# Patient Record
Sex: Male | Born: 1960
Health system: Southern US, Community
[De-identification: ages and names within clinical notes are randomized; demographics above are authoritative.]

## PROBLEM LIST (undated history)

## (undated) DIAGNOSIS — C801 Malignant (primary) neoplasm, unspecified: Secondary | ICD-10-CM

## (undated) DIAGNOSIS — M1990 Unspecified osteoarthritis, unspecified site: Secondary | ICD-10-CM

## (undated) DIAGNOSIS — N189 Chronic kidney disease, unspecified: Secondary | ICD-10-CM

## (undated) DIAGNOSIS — R011 Cardiac murmur, unspecified: Secondary | ICD-10-CM

## (undated) DIAGNOSIS — I10 Essential (primary) hypertension: Secondary | ICD-10-CM

## (undated) HISTORY — DX: Chronic kidney disease, unspecified: N18.9

## (undated) HISTORY — DX: Cardiac murmur, unspecified: R01.1

## (undated) HISTORY — DX: Malignant (primary) neoplasm, unspecified: C80.1

## (undated) HISTORY — DX: Essential (primary) hypertension: I10

## (undated) HISTORY — DX: Unspecified osteoarthritis, unspecified site: M19.90

---

## 2000-12-27 ENCOUNTER — Encounter: Payer: Self-pay | Admitting: Emergency Medicine

## 2000-12-27 ENCOUNTER — Inpatient Hospital Stay (HOSPITAL_COMMUNITY): Admission: EM | Admit: 2000-12-27 | Discharge: 2000-12-31 | Payer: Self-pay | Admitting: Emergency Medicine

## 2005-07-09 ENCOUNTER — Encounter: Admission: RE | Admit: 2005-07-09 | Discharge: 2005-07-09 | Payer: Self-pay | Admitting: Family Medicine

## 2006-08-28 IMAGING — CT CT PELVIS W/O CM
1 of 2 series · 15 of 32 positions shown, 19 images · IV contrast (agent unspecified)
Comparison: none

CLINICAL DATA: Hematuria.  Some right abdominal pain. 
 ABDOMEN CT WITHOUT CONTRAST:
TECHNIQUE: Multidetector CT imaging of the abdomen was performed following the standard protocol without IV contrast.
TECHNIQUE: Multidetector CT imaging of the pelvis was performed following the standard protocol without IV contrast.

[Series 2: renal stone · axial · 0.70mm/px · z∈[-375,-25]mm · 15 of 79 slices shown, 19 images]
[im 6/79  soft-tissue]
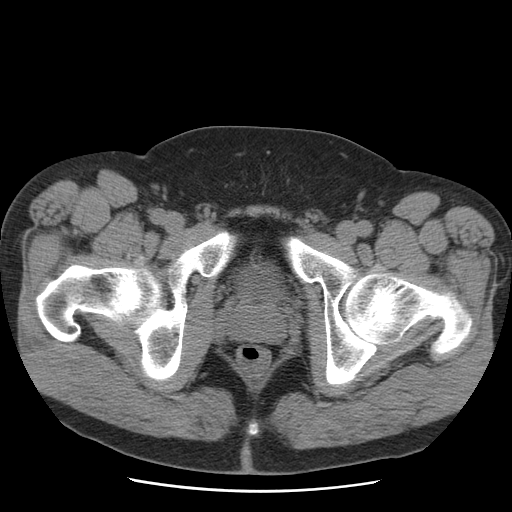
[im 6/79  bone]
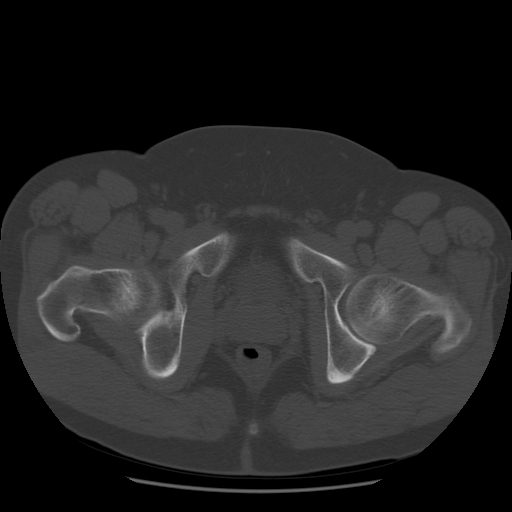
[im 11/79  soft-tissue]
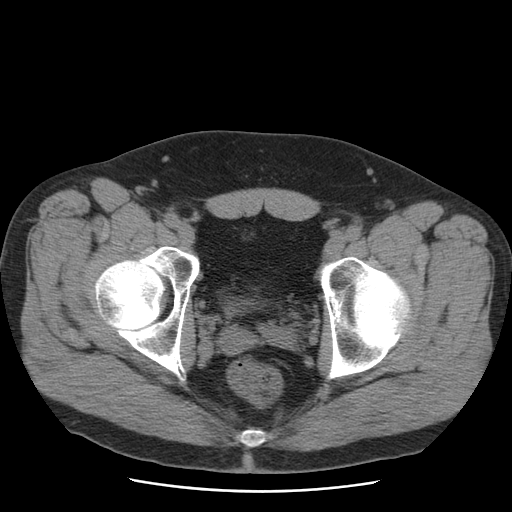
[im 17/79  soft-tissue]
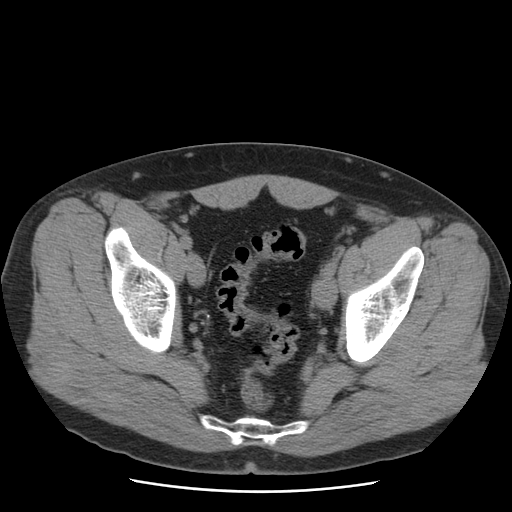
[im 22/79  soft-tissue]
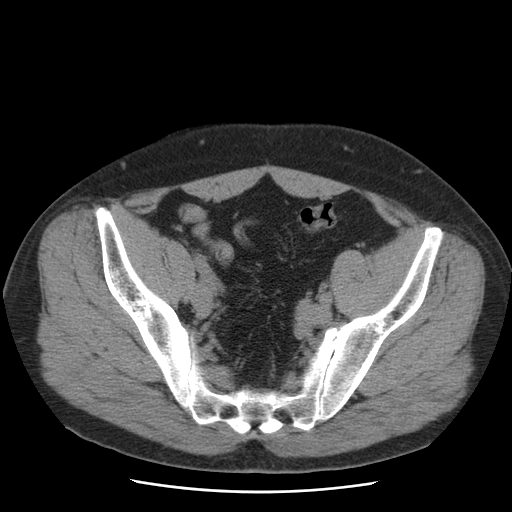
[im 27/79  soft-tissue]
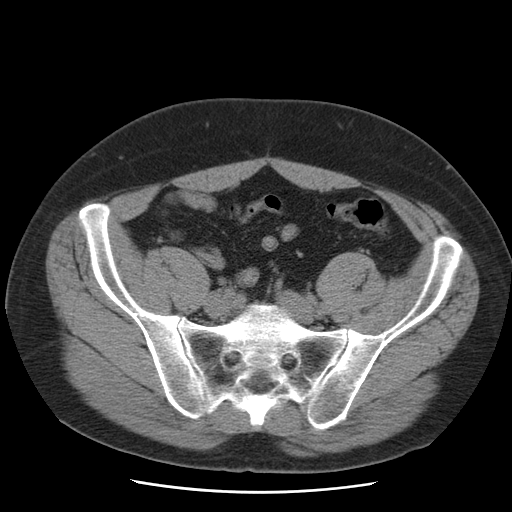
[im 33/79  soft-tissue]
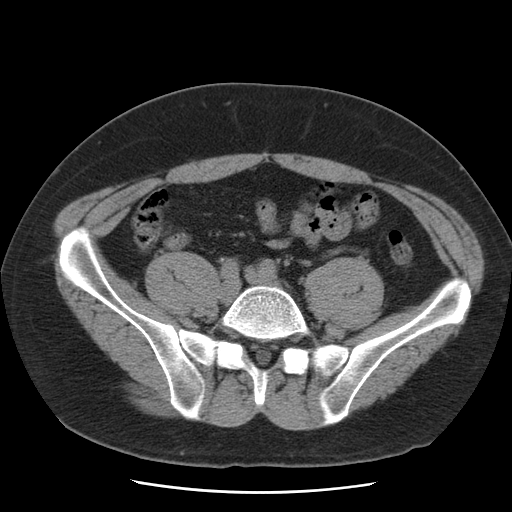
[im 41/79  soft-tissue]
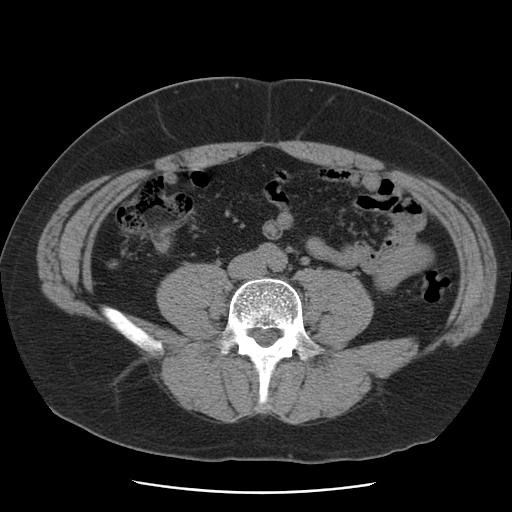
[im 46/79  soft-tissue]
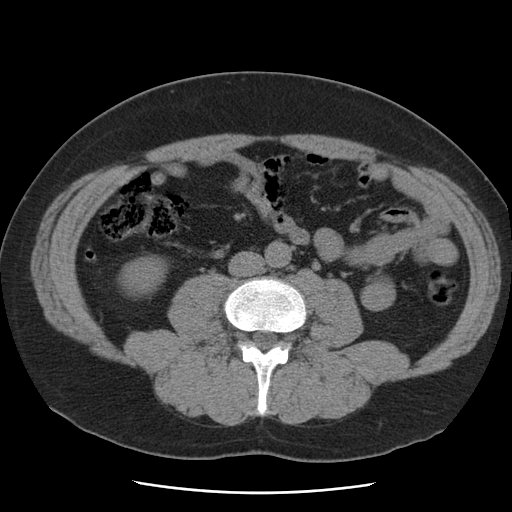
[im 52/79  soft-tissue]
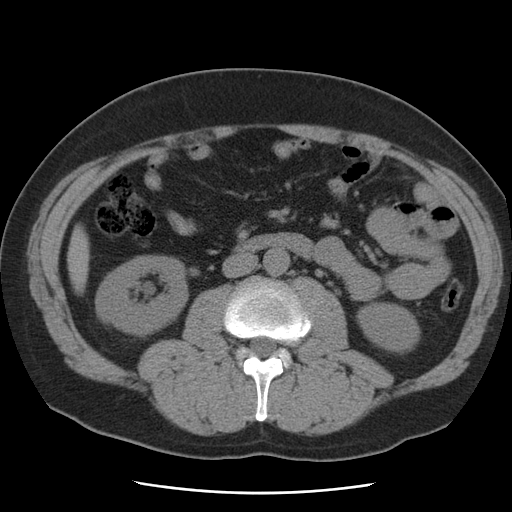
[im 52/79  bone]
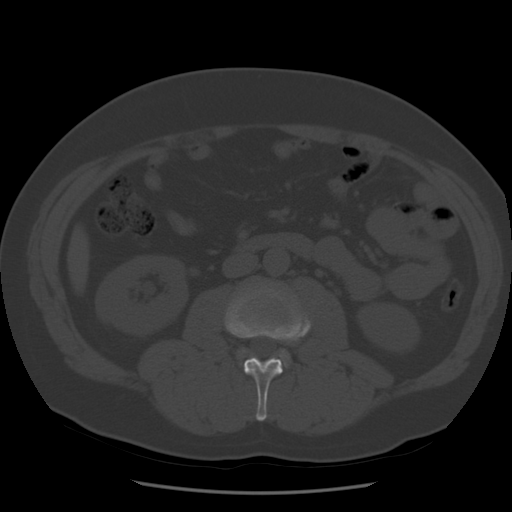
[im 57/79  soft-tissue]
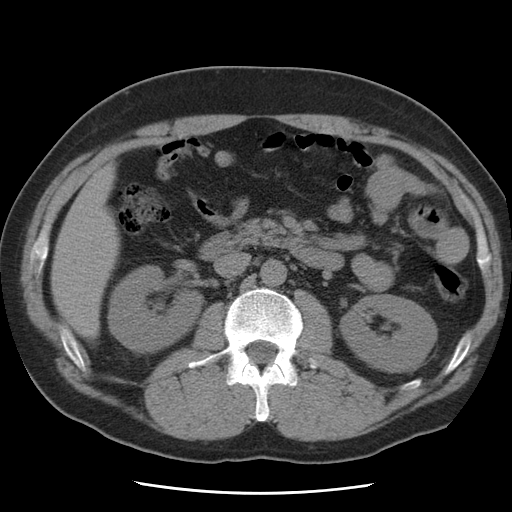
[im 62/79  soft-tissue]
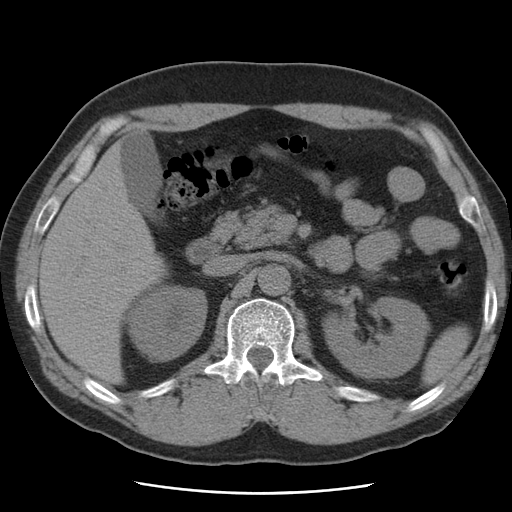
[im 68/79  soft-tissue]
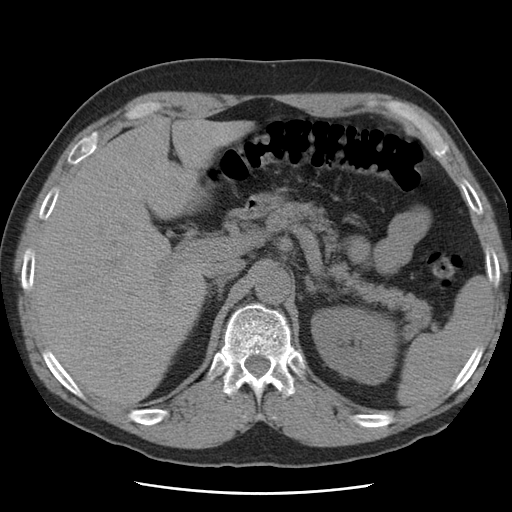
[im 68/79  lung]
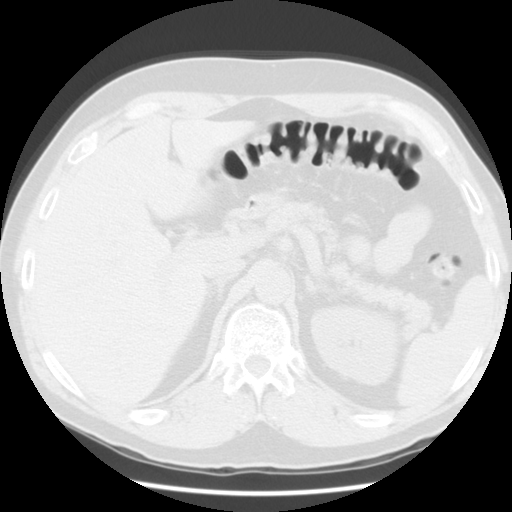
[im 70/79  lung]
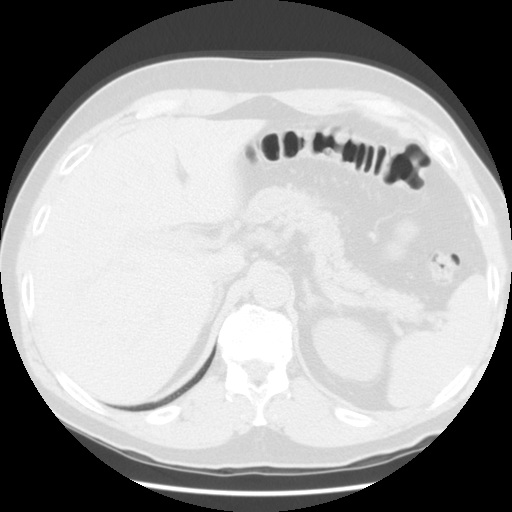
[im 73/79  soft-tissue]
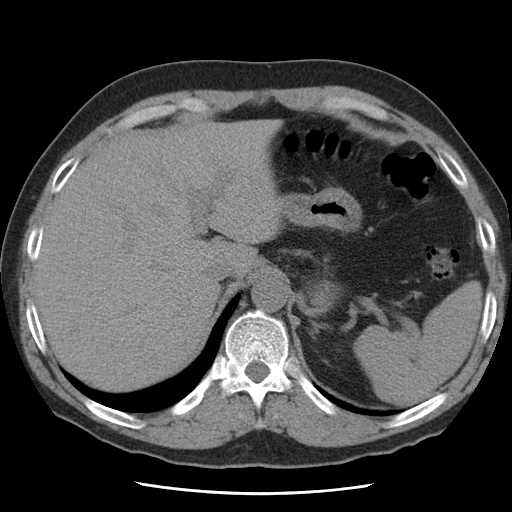
[im 73/79  lung]
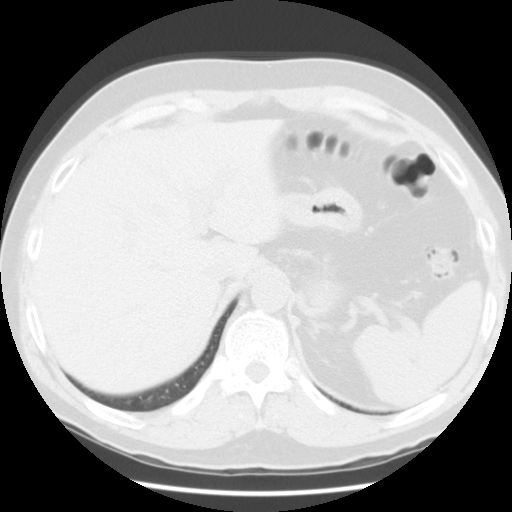
[im 76/79  lung]
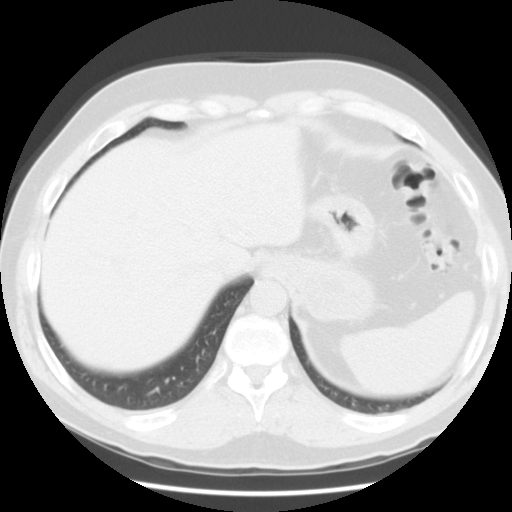

[15 of 32 positions shown; findings below may reference images not displayed]

FINDINGS: The lung bases are clear.  Only a small non-obstructing left upper pole renal calculus is noted of no more than 2 mm.  However there does appear to be fullness of the right pelvocaliceal system and right ureter and CT of the pelvis will be performed.  The liver appears grossly normal in the unenhanced state.  No calcified gallstones are seen.  The pancreas is normal with normal peripancreatic fat planes.  The adrenal glands and spleen appear normal.  The abdominal aorta is normal in caliber.
IMPRESSION: 1.  Mild right hydronephrosis and minimal fullness of the proximal right ureter.  CT of the pelvis to be performed.  
 2.  Single non-obstructing left upper pole renal calculus. 
 PELVIS CT WITHOUT CONTRAST:
FINDINGS: The right ureter is minimally prominent to point of partial obstruction by a right ureteral calculus on image number 47 measuring 2 mm.  The more distal ureters are normal in caliber.  The urinary bladder is decompressed and unremarkable.  The prostate is normal in size.  A few scattered rectosigmoid colonic diverticula are noted.  The appendix appears normal.
IMPRESSION: 1.  2 mm minimally obstructing distal right ureteral calculus overlying the right sacrum. 
 2.  Mild diverticulosis.  
 3.  Appendix appears normal.

## 2007-05-28 ENCOUNTER — Encounter: Admission: RE | Admit: 2007-05-28 | Discharge: 2007-05-28 | Payer: Self-pay | Admitting: Family Medicine

## 2008-07-16 IMAGING — CR DG CHEST 2V
2 series · 2 of 2 positions shown · non-contrast
Comparison: none

CLINICAL DATA: Cough for six months. 
 CHEST - 2 VIEW:

[w chest pa]
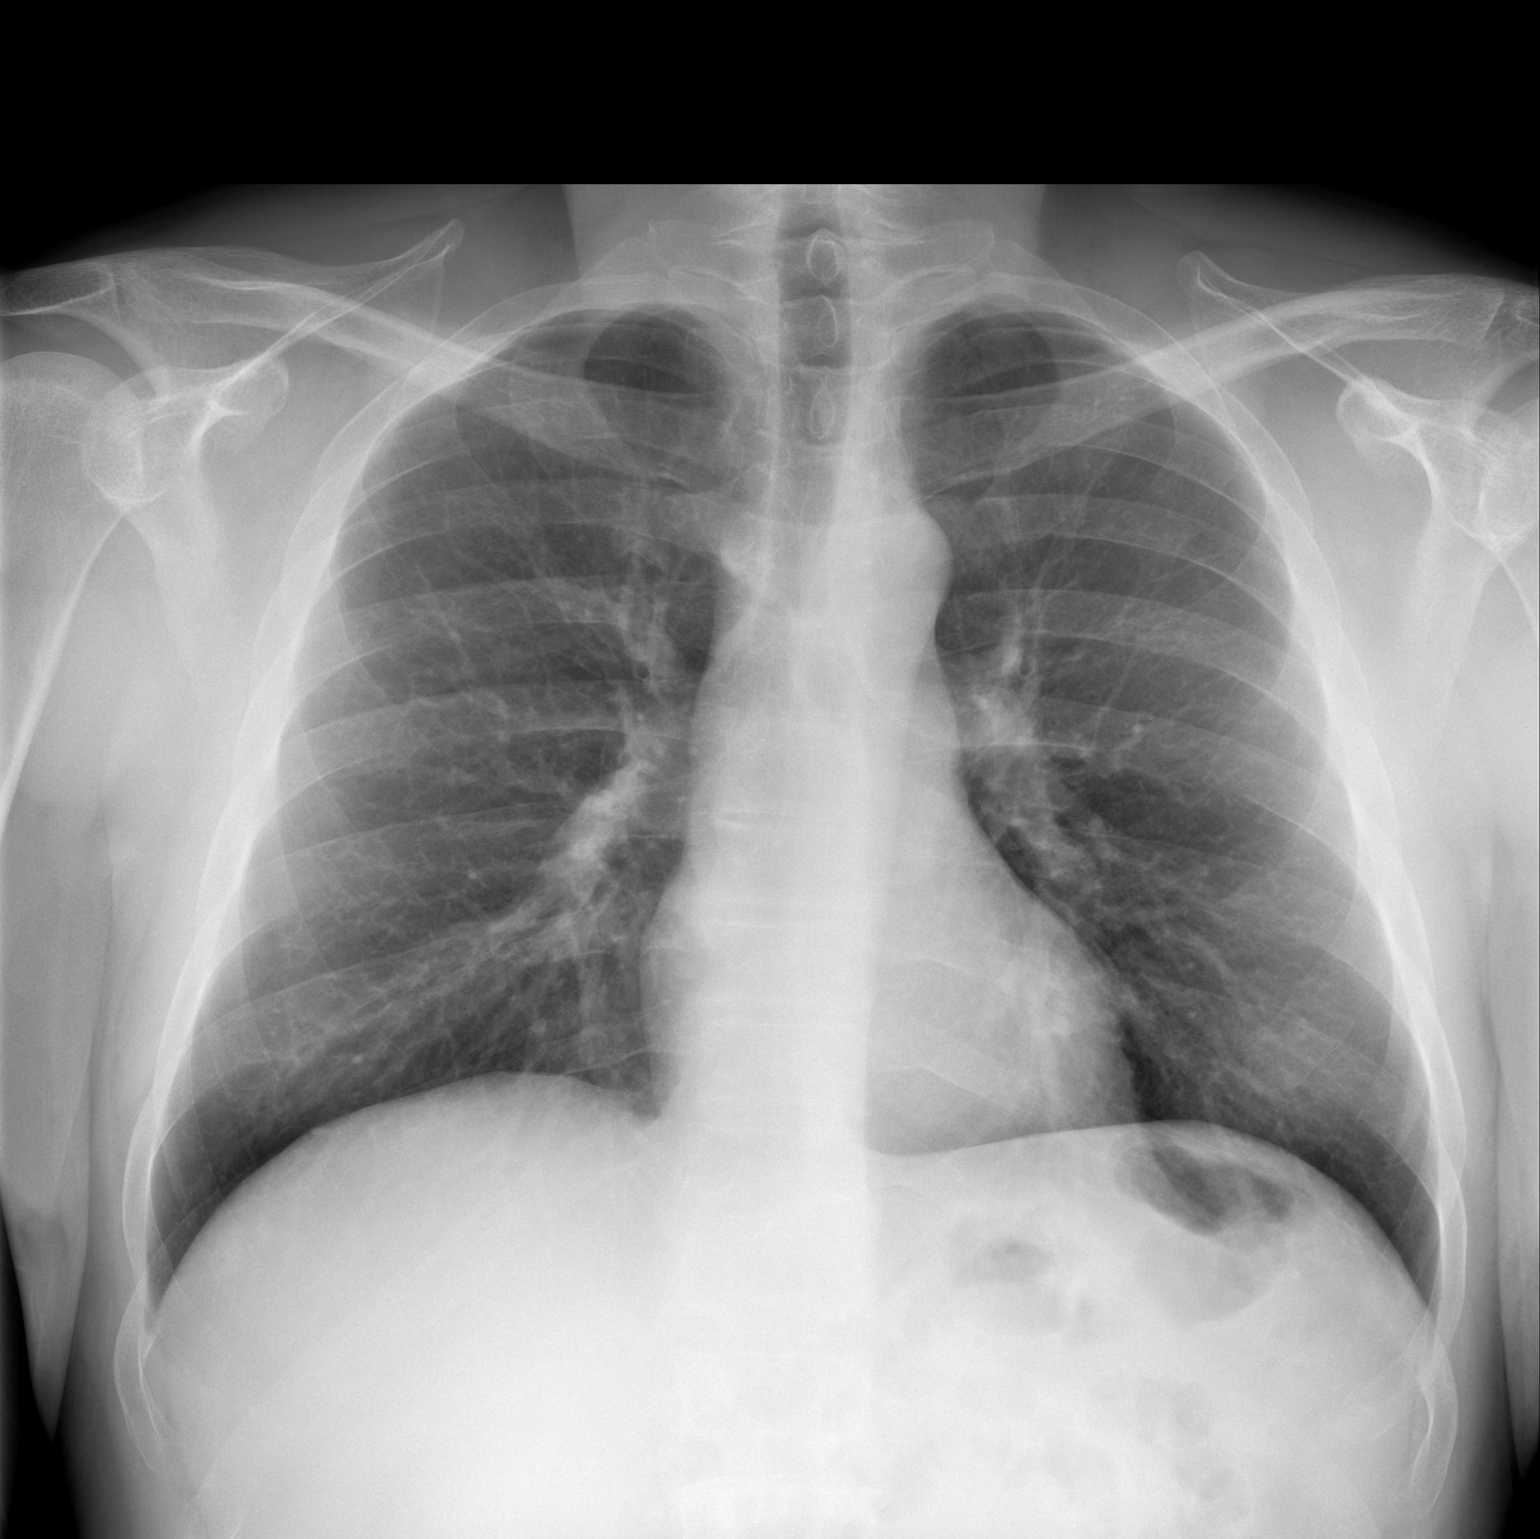

[w chest lat]
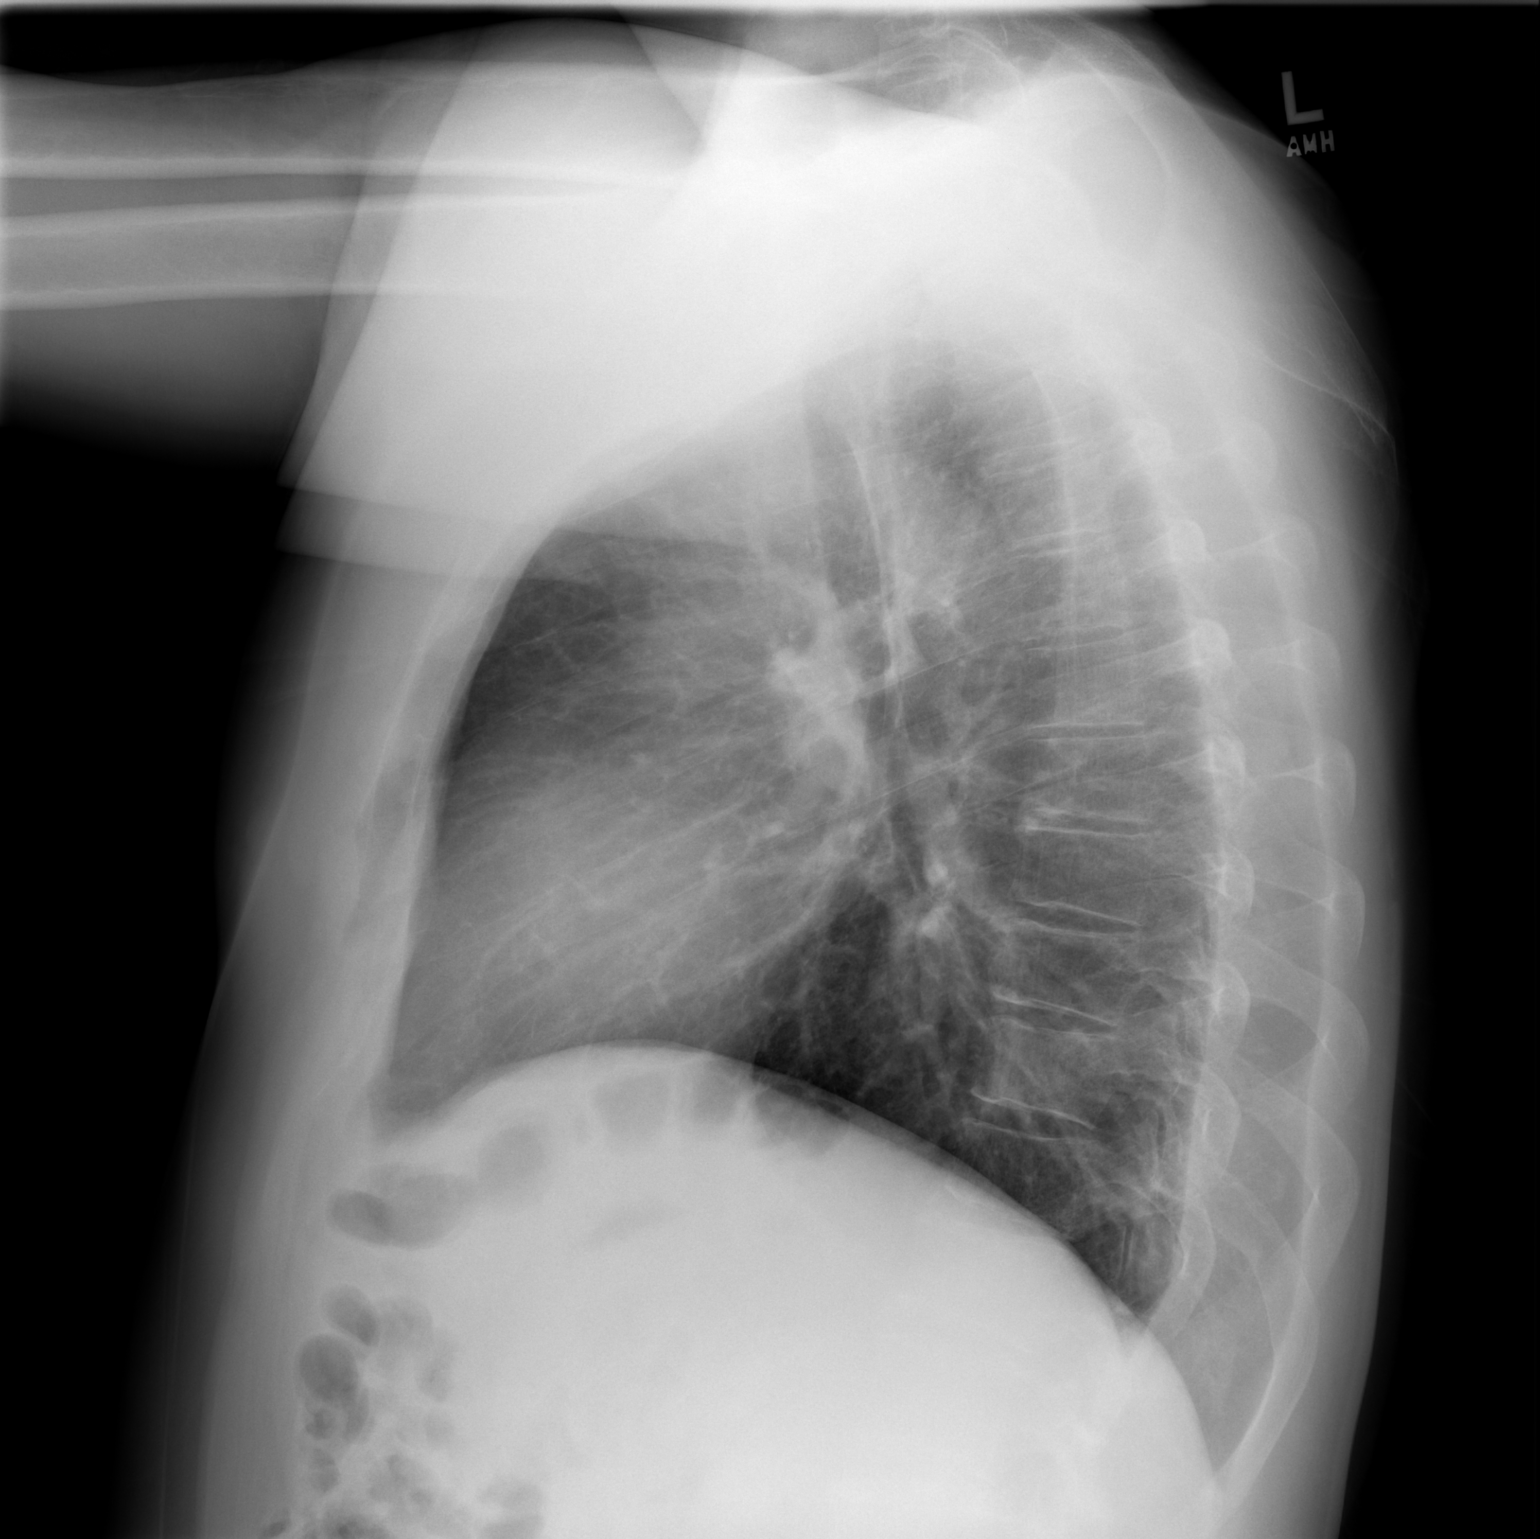

[2 of 2 positions shown; findings below may reference images not displayed]

FINDINGS: Heart size is normal.  The mediastinum is unremarkable.  The lungs are clear.  No effusions.  No soft tissue or bony abnormalities.
IMPRESSION: Normal chest.

## 2011-03-07 NOTE — H&P (Signed)
[place]. [facility]  Patient:    Tony Jacobs, Tony Jacobs                         MRN: [id number] Adm. Date:  [id number] Attending:  Benita Stabile                         History and Physical  CHIEF COMPLAINT: Fever and rash.  HISTORY OF PRESENT ILLNESS: The patient is a 50 year old white male with long-standing rheumatoid arthritis, treated with methotrexate and Plaquenil, with no change for a long time.  Three weeks ago he started having fever and aggravation of his knees and fingers and was started on low-dose prednisone. Several days later he developed a rash involving his back and his legs only. The swelling has continued to get worse and he has developed persistent fever since that time.  He was seen in the office by Dr. Valentina Lucks and no definitive diagnosis was made at that time.  He was then seen by his rheumatologist at [facility], Dr. Concepcion Living, and he had aspiration of his right knee joint with dark yellow fluid noted.  It was not actually cloudy and was sent for culture but was reportedly negative.  He also had multiple other laboratories that were all reportedly normal, but the actual results are not known.  He has been recently taking a taper of higher dose prednisone with improvement of the rash but it did not go away completely.  He has not had any focal or specific symptoms such as cough, shortness of breath, chest pain, heart fluttering, difficulty with bowel movements, urination, or anything else.  He does have generalized aching but no specific pain.  PAST MEDICAL HISTORY: No surgery.  CURRENT MEDICATIONS:  1. Methotrexate 0.5 mg q.week.  2. Plaquenil 200 mg b.i.d.  3. Folic acid q.d.  4. Prednisone recently only.  ALLERGIES: No known drug allergies.  SOCIAL HISTORY: He does heavy work in a sawmill.  He lives with his wife.  He does not drink alcohol or smoke, and he does not take any drugs.  FAMILY HISTORY:  Unremarkable.  REVIEW OF SYSTEMS: Unremarkable as noted above except for his history of present illness.  PHYSICAL EXAMINATION:  GENERAL: He is in moderate distress, with frequent chills.  VITAL SIGNS: Temperature 101.2 degrees, pulse 126, respirations 20, blood pressure 121/70.  Oxygen saturation 99%.  HEENT: Unremarkable.  NECK: Good carotid upstrokes with no bruits.  Neck supple, with no pain.  LUNGS: Clear with good air movement and no rales or wheezes.  CARDIOVASCULAR: Regular, with no murmurs, rubs, or gallops.  ABDOMEN: Soft, nontender, with no mass, guarding, or rebound; bowel sounds normal.  EXTREMITIES: Swelling of the left knee noted extending down into the lower leg with no erythema or any significant warmth to that area.  He also has some edema of his finger joints, with no erythema or warmth there.  Normal pulses throughout.  He has good range of motion of all other joints.  SKIN: A faint maculopapular rash if noted involving his upper back only, with no other abnormalities.  He has no adenopathy involving his cervical, axillary, supraclavicular, inguinal, popliteal, or antecubital areas.  NEUROLOGIC: Motor, sensory, DTRs, mental status all normal.  LABORATORY DATA: Comprehensive metabolic profile is unremarkable.  Mono test is negative.  CBC reveals a hemoglobin of 13.0, WBC 25,900 with 73% neutrophils, 17% bands, 7% lymphocytes; platelet count  359,000.  Sedimentation rate 80.  IMPRESSION:  1. Fever of unknown origin with increased WBC and left shift, as well as     increased sedimentation rate.  Possibilities would include infection,     which is most likely, versus rheumatoid arthritis flare versus drug     reaction versus other.  2. Long-standing rheumatoid arthritis with recent flare-up.  PLAN: Blood cultures.  While waiting for those we will start the patient on Rocephin and Tequin intravenously.  He will be in a private room for isolation but no  other specific isolation measures are necessary.  Close observation and further plan will be based on the results of his clinical course and the follow-up laboratory and blood cultures. DD:  12/27/00 TD:  12/28/00 Job: [id number]

## 2011-03-07 NOTE — Discharge Summary (Signed)
[place]. [facility]  Patient:    Tony Jacobs, Tony Jacobs                         MRN: [id number] Adm. Date:  [id number] Disc. Date: [id number] Attending:  Anastasio Auerbach CC:         Gretta Arab. Valentina Lucks, M.D.  Helene Kelp, M.D.  Daniel B. Yetta Barre, M.D.  Ward Roxan Hockey, M.D.  Concepcion Living, M.D., [facility] Ph. [phone]   Discharge Summary  DATE OF BIRTH:  20-Jul-1961  DISCHARGE DIAGNOSES: 1. Adult Stills disease.    a. Fever x 3 weeks.    b. Maculopapular rash (biopsy consistent).    c. Arthralgias/arthritis (knees, elbows, hands).    d. History of possible rheumatoid arthritis, diagnosed at age 74.       1. Atypical presentation. 2. Transient diarrhea. 3. Oral candidiasis.    a. Mild symptoms of dysphagia. 4. Cardiomegaly on chest x-ray.    a. A 2-D echo (March 13), normal ejection fraction.  Normal valves.       Possible small pericardial effusion.  Diastolic dysfunction. 5. Leukocytosis.    a. Presumed secondary to #1. 6. Mild anemia.    a. Hemoglobin 12.1, MCV 85. 7. Mildly elevated alkaline phosphatase (129).    a. Etiology unclear. 8. Malnutrition and weight loss secondary to #1.    a. Albumin 2.3.  ALLERGIES:  No known drug allergies.  DISCHARGE MEDICATIONS: 1. (New) Prednisone 20 mg - take 3 pills q.d. x 7 days, then 2-1/2 pills q.d.    x 7 days, then 2 pills q.d. x 7 days, then 1-1/2 pills q.d. x 7 days, then    1 pill q.d.  Further instructions per Dr. Elijah Birk. 2. (New) Diflucan 100 mg q.d. x 2 weeks. 3. (New) Protonix 40 mg q.d. while on prednisone. 4. (New) Calcium plus vitamin D 1 pill b.i.d. 5. Tylenol extra strength 1-2 q.4h. p.r.n. breakthrough pain, maximum 6 per    day.  DISCHARGE CONDITION:  Stable.  The patient rash was improving.  His arthralgias and arthritis were improving.  He was afebrile.  DISCHARGE DISPOSITION:  To home with wife.  RECOMMENDED ACTIVITY:  No strenuous activity until followup with Dr.  Valentina Lucks. 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  RECOMMENDED DIET:  As tolerated.  FOLLOWUP: 1. Dr. Maurice Small on Thursday, March 21 at 9:00.  She should    reassess his arthralgias and rashes as well as his oral thrush. 2. The patient should schedule an appointment to see Dr. Elijah Birk the first    week in April.  At that visit, they will need to discuss resumption of    methotrexate and/or Plaquenil and continue with the prednisone taper.  CONSULTANTS: 1. Dr. Burnice Logan. 2. Dr. Chase Picket. 3. Dr. Arminda Resides.  PROCEDURES: 1. A two-view chest x-ray (March 10): cardiomegaly and mild pulmonary vascular    prominence.  Left base subsegmental atelectasis. 2. A 2-D echo (March 13):  Overall left ventricular systolic function normal.    An ejection fraction of 55-65%.  No regional wall motion abnormalities.    Increased relative contribution of atrial contraction to left ventricular    filling.  Abnormal left ventricular relaxation.  Left atrial size was at    the upper limits of normal.  Pulmonary veins grossly normal.  Trivial    effusions seen posteriorly.  Trivial pericardial effusion along the right  ventricular free wall. 3. Right upper arm skin biopsy to assess rash (March 12):  Unofficial results:    Perivascular infiltrate with moderate number of neutrophils.  OTHER PERTINENT LABORATORY TESTS THIS ADMISSION: 1. Blood cultures x 2 (March 10): No growth. 2. Neutrophil cytoplasmic antibody - IgG less than 1:16. 3. Stool cultures (March 12): Moderate yeast, reduced normal flora. 4. Rheumatoid factor 22 (normal range 0-20), ASO antibody 57 (normal is    0-199), C4 complement 23 (normal 16-47), C3 complement 201 (normal 88-201),    anti-nuclear antibody negative, C. difficile toxin negative, TSH 0.920,    urinalysis 0-5 WBCs and RBCs, urine protein 30, ESR 80 (normal 0-20),    monospot negative.  Discharge sodium 135, potassium 3.9,  chloride 99, bicarb 25, glucose 115, BUN 12, creatinine 0.7, calcium 8.4, hemoglobin 12.1, WBC 34,900, MCV 85, platelet count 486, absolute neutrophil count 32,800, greater than 20% bands. LABORATORY DATA PENDING AT THE TIME OF DISCHARGE:  Total complement, extractable nucleus, HLAB 27, finalized stool culture.  HOSPITAL COURSE: #1 - Adult Stills disease.  The patient is a 50 year old Caucasian gentleman who was diagnosed with rheumatoid arthritis at age 58.  He had a somewhat atypical presentation at that time with pain more so in the large joints.  He required intermittent dosing of steroids at approximately 1-1/2 years ago. Dr. Elijah Birk, his [facility] rheumatologist decided to place him on methotrexate and Plaquenil.  He had been doing fine up until about three weeks ago when he developed fevers associated with joint pains and rash.  Dr. Elijah Birk saw him at that time and stopped his methotrexate and Plaquenil and placed him on a low-dose prednisone taper.  Now three weeks later, he has continued to have fevers to 101.8 and higher with worsening arthritis, stiffness, and rash.  He was admitted to the hospital for fever of unknown origin.  Blood cultures are done and infectious disease specialist is consulted.  There was no clear focus for any infection, but we opted to keep him on Tequin until cultures came back negative, and they did.  We ultimately got Dr. Estill Bakes, rheumatologist, involved.  He recommended skin biopsy which was done by Dr. Karlyn Agee.  The biopsy was consistent with a diagnosis of Stills disease, as was the pattern of rash at presentation.  There was no evidence of vasculitis.  We also followed up a 2-D echo to make sure there were no valvular abnormalities and  an ASO was negative as well.  In conjunction with he rheumatologist at [facility], Dr. Elijah Birk, we opted to place him on a higher dose prednisone taper.  He will follow up with Dr. Elijah Birk in two to three weeks and  they will discuss the resumption of Plaquenil and methotrexate.  He will contact Dr. Valentina Lucks if he has problems in the meantime and she can consult both Dr. Phylliss Bob and Dr. Elijah Birk if needed.  #2 - Oral candidiasis.  The patient had yeast in the back of his throat and he also complained of some difficulty swallowing.  He did not have odynophagia which makes esophagitis less likely.  At this point, I have opted to empirically treat him with Diflucan for two weeks.  He will follow up with Dr. Valentina Lucks.  If his Candida recurs on the higher dose of prednisone, the Diflucan could be restarted and continued or he could be put on oral troches.  #3 - Mild cardiomegaly on x-ray.  The patient has a normal ejection fraction. There was possibly a  very small pericardial effusion, but no valvular abnormalities.  The one thing that was slightly abnormal was impaired relaxation during diastole.  The significance of this is unclear.  It is generally seen in patients with chronic hypertension or the elderly.  I discussed this with Dr. Mayford Knife and Dr. Valentina Lucks could consider repeating this in a year, although there is no data to suggest that this is standard of care. D:  01/01/01 TD:  01/01/01 Job: [id number]

## 2014-02-16 ENCOUNTER — Encounter: Payer: Self-pay | Admitting: Internal Medicine

## 2014-03-30 ENCOUNTER — Ambulatory Visit (AMBULATORY_SURGERY_CENTER): Payer: BC Managed Care – PPO | Admitting: *Deleted

## 2014-03-30 ENCOUNTER — Encounter: Payer: Self-pay | Admitting: Internal Medicine

## 2014-03-30 VITALS — Ht 71.0 in | Wt 221.6 lb

## 2014-03-30 DIAGNOSIS — Z1211 Encounter for screening for malignant neoplasm of colon: Secondary | ICD-10-CM

## 2014-03-30 MED ORDER — MOVIPREP 100 G PO SOLR: ORAL | Status: DC

## 2014-03-30 NOTE — Progress Notes (Signed)
No egg or soy allergy  No home oxygen use   No medications for weight loss taken  emmi information given

## 2014-04-14 ENCOUNTER — Encounter: Payer: Self-pay | Admitting: Internal Medicine

## 2014-04-14 ENCOUNTER — Ambulatory Visit (AMBULATORY_SURGERY_CENTER): Payer: BC Managed Care – PPO | Admitting: Internal Medicine

## 2014-04-14 VITALS — BP 128/85 | HR 75 | Temp 97.7°F | Resp 20 | Ht 71.0 in | Wt 221.0 lb

## 2014-04-14 DIAGNOSIS — Z1211 Encounter for screening for malignant neoplasm of colon: Secondary | ICD-10-CM

## 2014-04-14 MED ORDER — SODIUM CHLORIDE 0.9 % IV SOLN: 500.0000 mL | INTRAVENOUS | Status: DC

## 2014-04-14 NOTE — Op Note (Signed)
[place]  [address]. [place], [id number]   COLONOSCOPY PROCEDURE REPORT  PATIENT: Tony Jacobs, Tony Jacobs  MR#: [id number] BIRTHDATE: 05-11-1961 , 52  yrs. old GENDER: Male ENDOSCOPIST: Eustace Quail, MD REFERRED HX:[id number] Griffin, M.D. PROCEDURE DATE:  04/14/2014 PROCEDURE:   Colonoscopy, screening First Screening Colonoscopy - Avg.  risk and is 50 yrs.  old or older Yes.  Prior Negative Screening - Now for repeat screening. N/A  History of Adenoma - Now for follow-up colonoscopy & has been > or = to 3 yrs.  N/A  Polyps Removed Today? No.  Recommend repeat exam, <10 yrs? No. ASA CLASS:   Class II INDICATIONS:average risk screening. MEDICATIONS: MAC sedation, administered by CRNA and propofol (Diprivan) 250mg  IV  DESCRIPTION OF PROCEDURE:   After the risks benefits and alternatives of the procedure were thoroughly explained, informed consent was obtained.  A digital rectal exam revealed no abnormalities of the rectum.   The LB [id number]  endoscope was introduced through the anus and advanced to the cecum, which was identified by both the appendix and ileocecal valve. No adverse events experienced.   The quality of the prep was excellent, using MoviPrep  The instrument was then slowly withdrawn as the colon was fully examined.   COLON FINDINGS: Mild diverticulosis was noted in the sigmoid colon. The colon was otherwise normal.  There was no  inflammation, polyps or cancers unless previously stated.  Retroflexed views revealed internal hemorrhoids. The time to cecum=1 minutes 39 seconds.  Withdrawal time=8 minutes 30 seconds.  The scope was withdrawn and the procedure completed. COMPLICATIONS: There were no complications.  ENDOSCOPIC IMPRESSION: 1.   Mild diverticulosis was noted in the sigmoid colon 2.   The colon was otherwise normal  RECOMMENDATIONS: 1. Continue current colorectal screening recommendations for "routine risk" patients with a  repeat colonoscopy in 10 years.   eSigned:  Eustace Quail, MD 04/14/2014 12:47 PM   cc: Kelton Pillar, MD and The Patient

## 2014-04-14 NOTE — Patient Instructions (Signed)

## 2014-04-14 NOTE — Progress Notes (Signed)
Report to PACU, RN, vss, BBS= Clear.  

## 2014-04-17 ENCOUNTER — Telehealth: Payer: Self-pay

## 2014-04-17 NOTE — Telephone Encounter (Signed)
Left a message at [phone] for the pt to call back if he has any questions or concerns. maw

## 2016-02-13 DIAGNOSIS — M061 Adult-onset Still's disease: Secondary | ICD-10-CM | POA: Diagnosis not present

## 2016-02-17 DIAGNOSIS — S51831A Puncture wound without foreign body of right forearm, initial encounter: Secondary | ICD-10-CM | POA: Diagnosis not present

## 2016-02-25 DIAGNOSIS — M0800 Unspecified juvenile rheumatoid arthritis of unspecified site: Secondary | ICD-10-CM | POA: Diagnosis not present

## 2016-02-25 DIAGNOSIS — R7303 Prediabetes: Secondary | ICD-10-CM | POA: Diagnosis not present

## 2016-02-25 DIAGNOSIS — Z0000 Encounter for general adult medical examination without abnormal findings: Secondary | ICD-10-CM | POA: Diagnosis not present

## 2016-02-25 DIAGNOSIS — I10 Essential (primary) hypertension: Secondary | ICD-10-CM | POA: Diagnosis not present

## 2016-02-25 DIAGNOSIS — Z8042 Family history of malignant neoplasm of prostate: Secondary | ICD-10-CM | POA: Diagnosis not present

## 2016-03-10 DIAGNOSIS — M0820 Juvenile rheumatoid arthritis with systemic onset, unspecified site: Secondary | ICD-10-CM | POA: Diagnosis not present

## 2016-05-19 DIAGNOSIS — M0820 Juvenile rheumatoid arthritis with systemic onset, unspecified site: Secondary | ICD-10-CM | POA: Diagnosis not present

## 2016-06-12 DIAGNOSIS — L570 Actinic keratosis: Secondary | ICD-10-CM | POA: Diagnosis not present

## 2016-07-28 DIAGNOSIS — M0820 Juvenile rheumatoid arthritis with systemic onset, unspecified site: Secondary | ICD-10-CM | POA: Diagnosis not present

## 2016-08-28 DIAGNOSIS — B356 Tinea cruris: Secondary | ICD-10-CM | POA: Diagnosis not present

## 2016-08-28 DIAGNOSIS — L249 Irritant contact dermatitis, unspecified cause: Secondary | ICD-10-CM | POA: Diagnosis not present

## 2016-09-03 DIAGNOSIS — Z23 Encounter for immunization: Secondary | ICD-10-CM | POA: Diagnosis not present

## 2016-09-03 DIAGNOSIS — M064 Inflammatory polyarthropathy: Secondary | ICD-10-CM | POA: Diagnosis not present

## 2016-09-03 DIAGNOSIS — Z79899 Other long term (current) drug therapy: Secondary | ICD-10-CM | POA: Diagnosis not present

## 2016-09-03 DIAGNOSIS — R21 Rash and other nonspecific skin eruption: Secondary | ICD-10-CM | POA: Diagnosis not present

## 2016-09-15 DIAGNOSIS — I10 Essential (primary) hypertension: Secondary | ICD-10-CM | POA: Diagnosis not present

## 2016-09-15 DIAGNOSIS — R7303 Prediabetes: Secondary | ICD-10-CM | POA: Diagnosis not present

## 2016-09-25 DIAGNOSIS — K120 Recurrent oral aphthae: Secondary | ICD-10-CM | POA: Diagnosis not present

## 2016-10-08 DIAGNOSIS — M0820 Juvenile rheumatoid arthritis with systemic onset, unspecified site: Secondary | ICD-10-CM | POA: Diagnosis not present

## 2016-12-15 DIAGNOSIS — M0820 Juvenile rheumatoid arthritis with systemic onset, unspecified site: Secondary | ICD-10-CM | POA: Diagnosis not present

## 2017-02-04 DIAGNOSIS — N342 Other urethritis: Secondary | ICD-10-CM | POA: Diagnosis not present

## 2017-02-04 DIAGNOSIS — N390 Urinary tract infection, site not specified: Secondary | ICD-10-CM | POA: Diagnosis not present

## 2017-02-23 DIAGNOSIS — M0820 Juvenile rheumatoid arthritis with systemic onset, unspecified site: Secondary | ICD-10-CM | POA: Diagnosis not present

## 2017-03-22 DIAGNOSIS — J069 Acute upper respiratory infection, unspecified: Secondary | ICD-10-CM | POA: Diagnosis not present

## 2017-05-04 DIAGNOSIS — M0820 Juvenile rheumatoid arthritis with systemic onset, unspecified site: Secondary | ICD-10-CM | POA: Diagnosis not present

## 2017-06-04 DIAGNOSIS — I10 Essential (primary) hypertension: Secondary | ICD-10-CM | POA: Diagnosis not present

## 2017-06-04 DIAGNOSIS — Z0000 Encounter for general adult medical examination without abnormal findings: Secondary | ICD-10-CM | POA: Diagnosis not present

## 2017-06-04 DIAGNOSIS — Z125 Encounter for screening for malignant neoplasm of prostate: Secondary | ICD-10-CM | POA: Diagnosis not present

## 2017-06-04 DIAGNOSIS — R7303 Prediabetes: Secondary | ICD-10-CM | POA: Diagnosis not present

## 2017-06-04 DIAGNOSIS — M0800 Unspecified juvenile rheumatoid arthritis of unspecified site: Secondary | ICD-10-CM | POA: Diagnosis not present

## 2017-07-13 DIAGNOSIS — M0820 Juvenile rheumatoid arthritis with systemic onset, unspecified site: Secondary | ICD-10-CM | POA: Diagnosis not present

## 2017-09-18 DIAGNOSIS — Z79899 Other long term (current) drug therapy: Secondary | ICD-10-CM | POA: Diagnosis not present

## 2017-09-18 DIAGNOSIS — M064 Inflammatory polyarthropathy: Secondary | ICD-10-CM | POA: Diagnosis not present

## 2017-09-18 DIAGNOSIS — M25562 Pain in left knee: Secondary | ICD-10-CM | POA: Diagnosis not present

## 2017-09-18 DIAGNOSIS — Z23 Encounter for immunization: Secondary | ICD-10-CM | POA: Diagnosis not present

## 2017-09-18 DIAGNOSIS — M25561 Pain in right knee: Secondary | ICD-10-CM | POA: Diagnosis not present

## 2017-09-18 DIAGNOSIS — M545 Low back pain: Secondary | ICD-10-CM | POA: Diagnosis not present

## 2017-09-21 DIAGNOSIS — M0820 Juvenile rheumatoid arthritis with systemic onset, unspecified site: Secondary | ICD-10-CM | POA: Diagnosis not present

## 2017-11-30 DIAGNOSIS — M0820 Juvenile rheumatoid arthritis with systemic onset, unspecified site: Secondary | ICD-10-CM | POA: Diagnosis not present

## 2017-12-03 DIAGNOSIS — I129 Hypertensive chronic kidney disease with stage 1 through stage 4 chronic kidney disease, or unspecified chronic kidney disease: Secondary | ICD-10-CM | POA: Diagnosis not present

## 2017-12-03 DIAGNOSIS — N183 Chronic kidney disease, stage 3 (moderate): Secondary | ICD-10-CM | POA: Diagnosis not present

## 2017-12-03 DIAGNOSIS — R7303 Prediabetes: Secondary | ICD-10-CM | POA: Diagnosis not present

## 2017-12-03 DIAGNOSIS — E7800 Pure hypercholesterolemia, unspecified: Secondary | ICD-10-CM | POA: Diagnosis not present

## 2018-02-08 DIAGNOSIS — M0820 Juvenile rheumatoid arthritis with systemic onset, unspecified site: Secondary | ICD-10-CM | POA: Diagnosis not present

## 2018-03-12 DIAGNOSIS — K1379 Other lesions of oral mucosa: Secondary | ICD-10-CM | POA: Diagnosis not present

## 2018-03-12 DIAGNOSIS — Z6828 Body mass index (BMI) 28.0-28.9, adult: Secondary | ICD-10-CM | POA: Diagnosis not present

## 2018-03-23 DIAGNOSIS — M064 Inflammatory polyarthropathy: Secondary | ICD-10-CM | POA: Diagnosis not present

## 2018-03-23 DIAGNOSIS — M25561 Pain in right knee: Secondary | ICD-10-CM | POA: Diagnosis not present

## 2018-03-23 DIAGNOSIS — Z79899 Other long term (current) drug therapy: Secondary | ICD-10-CM | POA: Diagnosis not present

## 2018-04-19 DIAGNOSIS — M0820 Juvenile rheumatoid arthritis with systemic onset, unspecified site: Secondary | ICD-10-CM | POA: Diagnosis not present

## 2018-06-28 DIAGNOSIS — M0820 Juvenile rheumatoid arthritis with systemic onset, unspecified site: Secondary | ICD-10-CM | POA: Diagnosis not present

## 2018-09-06 DIAGNOSIS — J069 Acute upper respiratory infection, unspecified: Secondary | ICD-10-CM | POA: Diagnosis not present

## 2018-09-06 DIAGNOSIS — Z6829 Body mass index (BMI) 29.0-29.9, adult: Secondary | ICD-10-CM | POA: Diagnosis not present

## 2018-09-06 DIAGNOSIS — B9789 Other viral agents as the cause of diseases classified elsewhere: Secondary | ICD-10-CM | POA: Diagnosis not present

## 2018-09-20 DIAGNOSIS — M0820 Juvenile rheumatoid arthritis with systemic onset, unspecified site: Secondary | ICD-10-CM | POA: Diagnosis not present

## 2018-10-05 DIAGNOSIS — E7800 Pure hypercholesterolemia, unspecified: Secondary | ICD-10-CM | POA: Diagnosis not present

## 2018-10-05 DIAGNOSIS — Z1159 Encounter for screening for other viral diseases: Secondary | ICD-10-CM | POA: Diagnosis not present

## 2018-10-05 DIAGNOSIS — I129 Hypertensive chronic kidney disease with stage 1 through stage 4 chronic kidney disease, or unspecified chronic kidney disease: Secondary | ICD-10-CM | POA: Diagnosis not present

## 2018-10-05 DIAGNOSIS — Z0000 Encounter for general adult medical examination without abnormal findings: Secondary | ICD-10-CM | POA: Diagnosis not present

## 2018-10-05 DIAGNOSIS — R7303 Prediabetes: Secondary | ICD-10-CM | POA: Diagnosis not present

## 2018-10-05 DIAGNOSIS — Z125 Encounter for screening for malignant neoplasm of prostate: Secondary | ICD-10-CM | POA: Diagnosis not present

## 2018-10-05 DIAGNOSIS — E161 Other hypoglycemia: Secondary | ICD-10-CM | POA: Diagnosis not present

## 2018-10-28 DIAGNOSIS — M25561 Pain in right knee: Secondary | ICD-10-CM | POA: Diagnosis not present

## 2018-10-28 DIAGNOSIS — M1711 Unilateral primary osteoarthritis, right knee: Secondary | ICD-10-CM | POA: Diagnosis not present

## 2018-11-22 DIAGNOSIS — D0422 Carcinoma in situ of skin of left ear and external auricular canal: Secondary | ICD-10-CM | POA: Diagnosis not present

## 2018-11-22 DIAGNOSIS — L570 Actinic keratosis: Secondary | ICD-10-CM | POA: Diagnosis not present

## 2018-11-22 DIAGNOSIS — D229 Melanocytic nevi, unspecified: Secondary | ICD-10-CM | POA: Diagnosis not present

## 2018-11-22 DIAGNOSIS — L821 Other seborrheic keratosis: Secondary | ICD-10-CM | POA: Diagnosis not present

## 2018-11-29 DIAGNOSIS — M0820 Juvenile rheumatoid arthritis with systemic onset, unspecified site: Secondary | ICD-10-CM | POA: Diagnosis not present

## 2018-12-09 DIAGNOSIS — D0422 Carcinoma in situ of skin of left ear and external auricular canal: Secondary | ICD-10-CM | POA: Diagnosis not present

## 2022-02-25 ENCOUNTER — Other Ambulatory Visit: Payer: Self-pay | Admitting: Family Medicine

## 2022-02-25 DIAGNOSIS — R109 Unspecified abdominal pain: Secondary | ICD-10-CM

## 2022-02-27 ENCOUNTER — Other Ambulatory Visit (HOSPITAL_COMMUNITY): Payer: Self-pay | Admitting: Family Medicine

## 2022-02-27 ENCOUNTER — Other Ambulatory Visit: Payer: Self-pay | Admitting: Family Medicine

## 2022-02-27 DIAGNOSIS — R7401 Elevation of levels of liver transaminase levels: Secondary | ICD-10-CM

## 2022-02-28 ENCOUNTER — Ambulatory Visit (HOSPITAL_BASED_OUTPATIENT_CLINIC_OR_DEPARTMENT_OTHER)
Admission: RE | Admit: 2022-02-28 | Discharge: 2022-02-28 | Disposition: A | Discharge status: Home / Self Care | Payer: 59 | Source: Ambulatory Visit | Attending: Family Medicine | Admitting: Family Medicine

## 2022-02-28 DIAGNOSIS — R7401 Elevation of levels of liver transaminase levels: Secondary | ICD-10-CM | POA: Diagnosis present

## 2022-03-06 ENCOUNTER — Other Ambulatory Visit: Payer: Self-pay

## 2022-04-20 ENCOUNTER — Other Ambulatory Visit: Payer: Self-pay

## 2022-04-20 ENCOUNTER — Emergency Department (HOSPITAL_BASED_OUTPATIENT_CLINIC_OR_DEPARTMENT_OTHER)
Admission: EM | Admit: 2022-04-20 | Discharge: 2022-04-20 | Disposition: A | Discharge status: Home / Self Care | Payer: 59 | Attending: Emergency Medicine | Admitting: Emergency Medicine

## 2022-04-20 ENCOUNTER — Encounter (HOSPITAL_BASED_OUTPATIENT_CLINIC_OR_DEPARTMENT_OTHER): Payer: Self-pay | Admitting: Obstetrics and Gynecology

## 2022-04-20 DIAGNOSIS — K047 Periapical abscess without sinus: Secondary | ICD-10-CM | POA: Diagnosis not present

## 2022-04-20 DIAGNOSIS — N189 Chronic kidney disease, unspecified: Secondary | ICD-10-CM | POA: Insufficient documentation

## 2022-04-20 DIAGNOSIS — R220 Localized swelling, mass and lump, head: Secondary | ICD-10-CM | POA: Diagnosis present

## 2022-04-20 DIAGNOSIS — I129 Hypertensive chronic kidney disease with stage 1 through stage 4 chronic kidney disease, or unspecified chronic kidney disease: Secondary | ICD-10-CM | POA: Insufficient documentation

## 2022-04-20 DIAGNOSIS — Z79899 Other long term (current) drug therapy: Secondary | ICD-10-CM | POA: Diagnosis not present

## 2022-04-20 MED ORDER — AMOXICILLIN-POT CLAVULANATE 875-125 MG PO TABS: 1.0000 | ORAL_TABLET | Freq: Two times a day (BID) | ORAL | 0 refills | Status: DC

## 2022-04-20 NOTE — Discharge Instructions (Signed)
Please use Tylenol or ibuprofen for pain.  You may use 600 mg ibuprofen every 6 hours or 1000 mg of Tylenol every 6 hours.  You may choose to alternate between the 2.  This would be most effective.  Not to exceed 4 g of Tylenol within 24 hours.  Not to exceed 3200 mg ibuprofen 24 hours.  Please take the entire course of antibiotics.  As we discussed they can cause some stomach upset, diarrhea so I recommend taking them on a full stomach, and with a probiotic or some yogurt.  Please follow-up with your dentist as you have planned and proceed with tooth extraction as this is likely what is causing your facial swelling.  If you begin to have worsening swelling, pain, difficulty breathing, difficulty tolerating your own saliva please return to the emergency department for further evaluation.

## 2022-04-20 NOTE — ED Provider Notes (Signed)
[place] EMERGENCY DEPT Provider Note   CSN: [id number] Arrival date & time: 04/20/22  1935     History  Chief Complaint  Patient presents with   Facial Swelling    Tony Jacobs is a 61 y.o. male with past medical history significant for CKD, hypertension who presents with concern for right-sided facial swelling that began earlier today.  Patient denies any difficulty breathing or swallowing.  Patient reports that he was at [employer] when the swelling started but has no known food allergies, did not eat anything similar from his normal diet.  He denies any mouth tingling.  He does note of some broken/infected teeth on the right that his dentist would like to pull.  He reports he has an appointment to follow-up with dentistry on Friday.  HPI     Home Medications Prior to Admission medications   Medication Sig Start Date End Date Taking? Authorizing Provider  amoxicillin-clavulanate (AUGMENTIN) 875-125 MG tablet Take 1 tablet by mouth every 12 (twelve) hours. 04/20/22  Yes Chisum Habenicht H, PA-C  aspirin 81 MG tablet Take 81 mg by mouth daily.    [provider]  folic acid (FOLVITE) 1 MG tablet Take by mouth daily.    [provider]  inFLIXimab (REMICADE) 100 MG injection Inject into the vein. Every 8 weeks    [provider]  MELATONIN PO Take by mouth daily.    [provider]  methotrexate (RHEUMATREX) 2.5 MG tablet Take by mouth. Takes 2.5 mg 2 tablets once weekly 08/29/08   [provider]  Multiple Vitamin tablet Take by mouth daily.    [provider]  solifenacin (VESICARE) 5 MG tablet Take by mouth daily. 10/18/09   [provider]  valsartan-hydrochlorothiazide (DIOVAN-HCT) 160-12.5 MG per tablet daily. 03/24/13   [provider]      Allergies    Patient has no known allergies.    Review of Systems   Review of Systems  HENT:  Positive for dental problem.   All other  systems reviewed and are negative.   Physical Exam Updated Vital Signs BP (!) 144/89   Pulse 91   Temp 97.9 F (36.6 C)   Resp 15   Ht '5\' 11"'$  (1.803 m)   Wt 102.1 kg   SpO2 99%   BMI 31.38 kg/m  Physical Exam Vitals and nursing note reviewed.  Constitutional:      General: He is not in acute distress.    Appearance: Normal appearance.  HENT:     Head: Normocephalic and atraumatic.     Mouth/Throat:     Comments: Very minimal swelling of the right cheek and face.  He has 1 missing tooth, and a broken tooth with obvious dental carie on the right.  No notable periapical abscess, peritonsillar abscess, uvular deviation.  No floor of mouth swelling or redness.  Tenderness or induration of parotid or other salivary glands.  Posterior oropharynx clear, intact swallow Eyes:     General:        Right eye: No discharge.        Left eye: No discharge.  Cardiovascular:     Rate and Rhythm: Normal rate and regular rhythm.  Pulmonary:     Effort: Pulmonary effort is normal. No respiratory distress.  Musculoskeletal:        General: No deformity.     Cervical back: Neck supple.  Lymphadenopathy:     Cervical: No cervical adenopathy.  Skin:  General: Skin is warm and dry.  Neurological:     Mental Status: He is alert and oriented to person, place, and time.  Psychiatric:        Mood and Affect: Mood normal.        Behavior: Behavior normal.     ED Results / Procedures / Treatments   Labs (all labs ordered are listed, but only abnormal results are displayed) Labs Reviewed - No data to display  EKG None  Radiology No results found.  Procedures Procedures    Medications Ordered in ED Medications - No data to display  ED Course/ Medical Decision Making/ A&P                           Medical Decision Making  This is an overall well-appearing 61 year old male who presents with concern for right-sided facial swelling that began suddenly today.  Patient with known  history of dental caries on the right.  Patient takes losartan for blood pressure, does not take an ACE.  He denies any known food allergies.  He denies any difficulty swallowing, or breathing.  On my evaluation patient with notable broken tooth and dental carie on the right.  I see no evidence of tongue edema, or other acute upper respiratory compromise.  Discussed with patient that the right-sided facial swelling is most likely related to his dental infection.  Encouraged him to follow-up with his dentist as planned, in the meantime prescribed him 1 week course of Augmentin.  Encouraged taking this on a full stomach, as well as with a probiotic or some yogurt.  Patient discharged in stable condition at this time, return precautions given. Final Clinical Impression(s) / ED Diagnoses Final diagnoses:  Dental infection  Swelling of face    Rx / DC Orders ED Discharge Orders          Ordered    amoxicillin-clavulanate (AUGMENTIN) 875-125 MG tablet  Every 12 hours        04/20/22 2135              Dorien Chihuahua 04/20/22 2141    Wyvonnia Dusky, MD 04/21/22 [phone]

## 2022-04-20 NOTE — ED Triage Notes (Signed)
Patient reports to the ER for right sided upper facial swelling. Patient has a patent airway and denies trouble breathing or swallowing. Patient also reports he has been yawning frequently. Patient denies any known food allergies but was at [employer] when the swelling started.

## 2022-05-23 ENCOUNTER — Ambulatory Visit: Payer: Self-pay | Admitting: Surgery

## 2022-05-23 ENCOUNTER — Encounter (HOSPITAL_COMMUNITY): Payer: Self-pay | Admitting: Surgery

## 2022-05-23 ENCOUNTER — Other Ambulatory Visit: Payer: Self-pay

## 2022-05-23 MED ORDER — HEPARIN SODIUM (PORCINE) 20000 UNIT/ML IJ SOLN: 5000.0000 [IU] | Freq: Once | INTRAMUSCULAR | Status: AC

## 2022-05-23 NOTE — Anesthesia Preprocedure Evaluation (Addendum)
Anesthesia Evaluation  Patient identified by MRN, date of birth, ID band Patient awake    Reviewed: Allergy & Precautions, NPO status , Patient's Chart, lab work & pertinent test results  Airway Mallampati: II  TM Distance: >3 FB Neck ROM: Full    Dental no notable dental hx. (+) Teeth Intact, Dental Advisory Given, Poor Dentition   Pulmonary neg pulmonary ROS,    Pulmonary exam normal breath sounds clear to auscultation       Cardiovascular hypertension, Pt. on medications + Valvular Problems/Murmurs  Rhythm:Regular Rate:Normal + Systolic murmurs    Neuro/Psych negative neurological ROS  negative psych ROS   GI/Hepatic negative GI ROS, Neg liver ROS,   Endo/Other  negative endocrine ROS  Renal/GU Renal disease  negative genitourinary   Musculoskeletal  (+) Arthritis , Rheumatoid disorders,    Abdominal   Peds negative pediatric ROS (+)  Hematology negative hematology ROS (+)   Anesthesia Other Findings   Reproductive/Obstetrics negative OB ROS                            Anesthesia Physical Anesthesia Plan  ASA: 3  Anesthesia Plan: General   Post-op Pain Management:    Induction: Intravenous  PONV Risk Score and Plan: 2 and Ondansetron, Dexamethasone and Treatment may vary due to age or medical condition  Airway Management Planned: Oral ETT  Additional Equipment: None  Intra-op Plan:   Post-operative Plan: Extubation in OR  Informed Consent:   Plan Discussed with: Anesthesiologist and CRNA  Anesthesia Plan Comments: (PAT note written 05/23/2022 by Myra Gianotti, PA-C. DISCUSSION: Patient is a 61 year old male scheduled for the above procedure.  History includes never smoker, HTN, CKD, prediabetes, nephrolithiasis, RA/inflammatory polyarthritis, skin cancer (lip), murmur.  Per PAT phone RN, patient's wife reported that years ago he was told he had a murmur but has  never had any issues like chest pain or SOB. He has not had an echocardiogram. He is not followed by a cardiologist. [facility] has limited record review in Care Everywhere, and murmur is no listed on active problem list.).  He is a same day work-up, so anesthesia team to evaluate on the day of surgery.  Labs and EKG anticipated on arrival for surgery.   Per wife he has been off Remicade since May.  VS: 02/25/22: BP 112/80 ([facility]))       Anesthesia Quick Evaluation

## 2022-05-23 NOTE — Progress Notes (Signed)
PCP - Osborne Casco, MD Cardiologist - denies  Chest x-ray - n/a EKG - will be done on the day of surgery Stress Test - denies ECHO - denies Cardiac Cath - denies  CPAP - n/a  Fasting Blood Sugar - n/a  Blood Thinner Instructions: n/a Aspirin Instructions: spoke with the patient's wife and instructed to call MD and ask if the patient must hold Aspirin prior his surgery. Wife verbalized understanding.  Patient was instructed: As of today, STOP taking any Aspirin (unless otherwise instructed by your surgeon) Aleve, Naproxen, Ibuprofen, Motrin, Advil, Goody's, BC's, all herbal medications, fish oil, and all vitamins.  Per wife patient stopped taking Remicade since May.    ERAS Protcol - n/a  COVID TEST- n/a  Anesthesia review:   Patient verbally denies any shortness of breath, fever, cough and chest pain during phone call   -------------  SDW INSTRUCTIONS given:  Your procedure is scheduled on Monday, August 7th, 2023.  Report to [facility] Main Entrance "A" at 08:30 A.M., and check in at the Admitting office.  Call this number if you have problems the morning of surgery:  [phone]   Remember:  Do not eat or drink after midnight the night before your surgery    Take these medicines the morning of surgery with A SIP OF WATER Prilosec    The day of surgery:                    Do not wear jewelry            Do not wear lotions, powders, colognes, or deodorant.            Men may shave face and neck.            Do not bring valuables to the hospital.            [facility] is not responsible for any belongings or valuables.  Do NOT Smoke (Tobacco/Vaping) 24 hours prior to your procedure If you use a CPAP at night, you may bring all equipment for your overnight stay.   Contacts, glasses, dentures or bridgework may not be worn into surgery.      For patients admitted to the hospital, discharge time will be determined by your treatment team.   Patients  discharged the day of surgery will not be allowed to drive home, and someone needs to stay with them for 24 hours.    Special instructions:   [facility]- Preparing For Surgery  Before surgery, you can play an important role. Because skin is not sterile, your skin needs to be as free of germs as possible. You can reduce the number of germs on your skin by washing with CHG (chlorahexidine gluconate) Soap before surgery.  CHG is an antiseptic cleaner which kills germs and bonds with the skin to continue killing germs even after washing.    Oral Hygiene is also important to reduce your risk of infection.  Remember - BRUSH YOUR TEETH THE MORNING OF SURGERY WITH YOUR REGULAR TOOTHPASTE  Please do not use if you have an allergy to CHG or antibacterial soaps. If your skin becomes reddened/irritated stop using the CHG.  Do not shave (including legs and underarms) for at least 48 hours prior to first CHG shower. It is OK to shave your face.  Please follow these instructions carefully.   Shower the NIGHT BEFORE SURGERY and the MORNING OF SURGERY with DIAL Soap.   Pat yourself dry  with a CLEAN TOWEL.  Wear CLEAN PAJAMAS to bed the night before surgery  Place CLEAN SHEETS on your bed the night of your first shower and DO NOT SLEEP WITH PETS.   Day of Surgery: Please shower morning of surgery  Wear Clean/Comfortable clothing the morning of surgery Do not apply any deodorants/lotions.   Remember to brush your teeth WITH YOUR REGULAR TOOTHPASTE.   Questions were answered. Patient verbalized understanding of instructions.

## 2022-05-23 NOTE — Progress Notes (Signed)
Anesthesia Chart Review: Tony Jacobs  Case: [id number] Date/Time: 05/26/22 1045   Procedure: LAPAROSCOPIC CHOLECYSTECTOMY   Anesthesia type: General   Pre-op diagnosis: symptomatic cholelithiasis   [facility] ROOM 02 / [facility] OR   Surgeons: Jesusita Oka, MD       DISCUSSION: Patient is a 61 year old male scheduled for the above procedure.  History includes never smoker, HTN, CKD, prediabetes, nephrolithiasis, RA/inflammatory polyarthritis, skin cancer (lip), murmur.  Per PAT phone RN, patient's wife reported that years ago he was told he had a murmur but has never had any issues like chest pain or SOB. He has not had an echocardiogram. He is not followed by a cardiologist. [facility] has limited record review in Care Everywhere, and murmur is no listed on active problem list.).  He is a same day work-up, so anesthesia team to evaluate on the day of surgery.  Labs and EKG anticipated on arrival for surgery.   Per wife he has been off Remicade since May.  VS: 02/25/22: BP 112/80 [facility])   PROVIDERS: Kelton Pillar, MD is PCP [facility]) Lanice Schwab, DO is rheumatologist ([facility])   LABS: For day of surgery as indicated. 03/21/22 PT/INR 11.0/1.0, PLT 260, H/H 15.4/44.0, AST 24, AL 33, Cr 1.12, glucose 120. A1c 6.0% 11/29/21 ([facility]).    IMAGES: [place] Abd 02/28/22: IMPRESSION: Cholelithiasis without evidence of acute cholecystitis.     EKG: Day of surgery.   CV: N/A  Past Medical History:  Diagnosis Date   Arthritis    RA   Cancer ([place])    on lip   Chronic kidney disease    kidney stone   Heart murmur    Hypertension     History reviewed. No pertinent surgical history.  MEDICATIONS: No current facility-administered medications for this encounter.    aspirin 81 MG tablet   diphenhydramine-acetaminophen (TYLENOL PM) 25-500 MG TABS tablet   folic acid (FOLVITE) 1 MG tablet   inFLIXimab (REMICADE) 100 MG injection    losartan-hydrochlorothiazide (HYZAAR) 100-12.5 MG tablet   MELATONIN PO   methotrexate (RHEUMATREX) 2.5 MG tablet   Multiple Vitamin tablet   pantoprazole (PROTONIX) 40 MG tablet    heparin injection 5,000 Units    Myra Gianotti, PA-C Surgical Short Stay/Anesthesiology [facility] Phone [phone] [facility] Phone [phone] 05/23/2022 4:09 PM

## 2022-05-26 ENCOUNTER — Encounter (HOSPITAL_COMMUNITY): Payer: Self-pay | Admitting: Surgery

## 2022-05-26 ENCOUNTER — Ambulatory Visit (HOSPITAL_COMMUNITY)
Admission: RE | Admit: 2022-05-26 | Discharge: 2022-05-26 | Disposition: A | Discharge status: Home / Self Care | Payer: 59 | Attending: Surgery | Admitting: Surgery

## 2022-05-26 ENCOUNTER — Other Ambulatory Visit: Payer: Self-pay

## 2022-05-26 ENCOUNTER — Ambulatory Visit (HOSPITAL_BASED_OUTPATIENT_CLINIC_OR_DEPARTMENT_OTHER): Payer: 59 | Admitting: Vascular Surgery

## 2022-05-26 ENCOUNTER — Encounter (HOSPITAL_COMMUNITY)
Admission: RE | Disposition: A | Discharge status: Home / Self Care | Payer: Self-pay | Source: Home / Self Care | Attending: Surgery

## 2022-05-26 ENCOUNTER — Ambulatory Visit (HOSPITAL_COMMUNITY): Payer: 59 | Admitting: Vascular Surgery

## 2022-05-26 DIAGNOSIS — Z85828 Personal history of other malignant neoplasm of skin: Secondary | ICD-10-CM | POA: Diagnosis not present

## 2022-05-26 DIAGNOSIS — Z79899 Other long term (current) drug therapy: Secondary | ICD-10-CM | POA: Insufficient documentation

## 2022-05-26 DIAGNOSIS — M069 Rheumatoid arthritis, unspecified: Secondary | ICD-10-CM

## 2022-05-26 DIAGNOSIS — N189 Chronic kidney disease, unspecified: Secondary | ICD-10-CM | POA: Diagnosis not present

## 2022-05-26 DIAGNOSIS — N289 Disorder of kidney and ureter, unspecified: Secondary | ICD-10-CM

## 2022-05-26 DIAGNOSIS — R7303 Prediabetes: Secondary | ICD-10-CM | POA: Diagnosis not present

## 2022-05-26 DIAGNOSIS — K8010 Calculus of gallbladder with chronic cholecystitis without obstruction: Secondary | ICD-10-CM | POA: Insufficient documentation

## 2022-05-26 DIAGNOSIS — K8020 Calculus of gallbladder without cholecystitis without obstruction: Secondary | ICD-10-CM

## 2022-05-26 DIAGNOSIS — I10 Essential (primary) hypertension: Secondary | ICD-10-CM

## 2022-05-26 DIAGNOSIS — M064 Inflammatory polyarthropathy: Secondary | ICD-10-CM | POA: Diagnosis not present

## 2022-05-26 DIAGNOSIS — I129 Hypertensive chronic kidney disease with stage 1 through stage 4 chronic kidney disease, or unspecified chronic kidney disease: Secondary | ICD-10-CM | POA: Insufficient documentation

## 2022-05-26 HISTORY — PX: CHOLECYSTECTOMY: SHX55

## 2022-05-26 LAB — BASIC METABOLIC PANEL
Anion gap: 6 (ref 5–15)
BUN: 11 mg/dL (ref 6–20)
CO2: 26 mmol/L (ref 22–32)
Calcium: 9.1 mg/dL (ref 8.9–10.3)
Chloride: 107 mmol/L (ref 98–111)
Creatinine, Ser: 1.11 mg/dL (ref 0.61–1.24)
GFR, Estimated: >60 mL/min (ref >60)
Glucose, Bld: 114 mg/dL — ABNORMAL HIGH (ref 70–99)
Potassium: 3.5 mmol/L (ref 3.5–5.1)
Sodium: 139 mmol/L (ref 135–145)

## 2022-05-26 LAB — CBC
HCT: 45.2 % (ref 39.0–52.0)
Hemoglobin: 16 g/dL (ref 13.0–17.0)
MCH: 32 pg (ref 26.0–34.0)
MCHC: 35.4 g/dL (ref 30.0–36.0)
MCV: 90.4 fL (ref 80.0–100.0)
Platelets: 263 10*3/uL (ref 150–400)
RBC: 5 MIL/uL (ref 4.22–5.81)
RDW: 12.5 % (ref 11.5–15.5)
WBC: 8.3 10*3/uL (ref 4.0–10.5)
nRBC: 0 % (ref 0.0–0.2)

## 2022-05-26 SURGERY — LAPAROSCOPIC CHOLECYSTECTOMY
Anesthesia: General | Site: Abdomen

## 2022-05-26 MED ORDER — MEPERIDINE HCL 25 MG/ML IJ SOLN: 6.2500 mg | INTRAMUSCULAR | Status: DC | PRN

## 2022-05-26 MED ORDER — ONDANSETRON HCL 4 MG/2ML IJ SOLN
INTRAMUSCULAR | Status: DC | PRN
  Administered 2022-05-26: 4 mg via INTRAVENOUS

## 2022-05-26 MED ORDER — ACETAMINOPHEN 500 MG PO TABS
1000.0000 mg | ORAL_TABLET | ORAL | Status: AC
  Administered 2022-05-26: 1000 mg via ORAL
  Filled 2022-05-26: qty 2

## 2022-05-26 MED ORDER — ROCURONIUM BROMIDE 10 MG/ML (PF) SYRINGE
PREFILLED_SYRINGE | INTRAVENOUS | Status: DC | PRN
  Administered 2022-05-26: 50 mg via INTRAVENOUS

## 2022-05-26 MED ORDER — MIDAZOLAM HCL 2 MG/2ML IJ SOLN
INTRAMUSCULAR | Status: DC | PRN
  Administered 2022-05-26 (×2): 1 mg via INTRAVENOUS

## 2022-05-26 MED ORDER — MIDAZOLAM HCL 2 MG/2ML IJ SOLN
INTRAMUSCULAR | Status: AC
  Filled 2022-05-26: qty 2

## 2022-05-26 MED ORDER — ONDANSETRON HCL 4 MG/2ML IJ SOLN
INTRAMUSCULAR | Status: AC
  Filled 2022-05-26: qty 2

## 2022-05-26 MED ORDER — CEFAZOLIN SODIUM-DEXTROSE 2-4 GM/100ML-% IV SOLN
2.0000 g | INTRAVENOUS | Status: AC
  Administered 2022-05-26: 2 g via INTRAVENOUS
  Filled 2022-05-26: qty 100

## 2022-05-26 MED ORDER — METHOCARBAMOL 750 MG PO TABS: 750.0000 mg | ORAL_TABLET | Freq: Four times a day (QID) | ORAL | 1 refills | Status: DC

## 2022-05-26 MED ORDER — DEXMEDETOMIDINE (PRECEDEX) IN NS 20 MCG/5ML (4 MCG/ML) IV SYRINGE
PREFILLED_SYRINGE | INTRAVENOUS | Status: DC | PRN
  Administered 2022-05-26: 8 ug via INTRAVENOUS

## 2022-05-26 MED ORDER — ORAL CARE MOUTH RINSE: 15.0000 mL | Freq: Once | OROMUCOSAL | Status: AC

## 2022-05-26 MED ORDER — LACTATED RINGERS IV SOLN: INTRAVENOUS | Status: DC

## 2022-05-26 MED ORDER — ACETAMINOPHEN 160 MG/5ML PO SOLN: 325.0000 mg | ORAL | Status: DC | PRN

## 2022-05-26 MED ORDER — LIDOCAINE 2% (20 MG/ML) 5 ML SYRINGE
INTRAMUSCULAR | Status: DC | PRN
  Administered 2022-05-26: 100 mg via INTRAVENOUS

## 2022-05-26 MED ORDER — ROCURONIUM BROMIDE 10 MG/ML (PF) SYRINGE
PREFILLED_SYRINGE | INTRAVENOUS | Status: AC
  Filled 2022-05-26: qty 10

## 2022-05-26 MED ORDER — CHLORHEXIDINE GLUCONATE 0.12 % MT SOLN
OROMUCOSAL | Status: AC
  Administered 2022-05-26: 15 mL via OROMUCOSAL
  Filled 2022-05-26: qty 15

## 2022-05-26 MED ORDER — BUPIVACAINE-EPINEPHRINE 0.25% -1:200000 IJ SOLN
INTRAMUSCULAR | Status: DC | PRN
  Administered 2022-05-26: 30 mL

## 2022-05-26 MED ORDER — DEXAMETHASONE SODIUM PHOSPHATE 10 MG/ML IJ SOLN
INTRAMUSCULAR | Status: DC | PRN
  Administered 2022-05-26: 10 mg via INTRAVENOUS

## 2022-05-26 MED ORDER — LIDOCAINE 2% (20 MG/ML) 5 ML SYRINGE
INTRAMUSCULAR | Status: AC
  Filled 2022-05-26: qty 5

## 2022-05-26 MED ORDER — PROPOFOL 10 MG/ML IV BOLUS
INTRAVENOUS | Status: DC | PRN
  Administered 2022-05-26: 150 mg via INTRAVENOUS

## 2022-05-26 MED ORDER — BUPIVACAINE-EPINEPHRINE (PF) 0.25% -1:200000 IJ SOLN
INTRAMUSCULAR | Status: AC
Start: 2022-05-26 — End: ?
  Filled 2022-05-26: qty 30

## 2022-05-26 MED ORDER — FENTANYL CITRATE (PF) 250 MCG/5ML IJ SOLN
INTRAMUSCULAR | Status: DC | PRN
  Administered 2022-05-26 (×2): 50 ug via INTRAVENOUS

## 2022-05-26 MED ORDER — DEXAMETHASONE SODIUM PHOSPHATE 10 MG/ML IJ SOLN
INTRAMUSCULAR | Status: AC
  Filled 2022-05-26: qty 1

## 2022-05-26 MED ORDER — SODIUM CHLORIDE 0.9 % IR SOLN
Status: DC | PRN
  Administered 2022-05-26: 1000 mL

## 2022-05-26 MED ORDER — OXYCODONE HCL 5 MG PO TABS: 5.0000 mg | ORAL_TABLET | Freq: Once | ORAL | Status: DC | PRN

## 2022-05-26 MED ORDER — DOCUSATE SODIUM 100 MG PO CAPS: 100.0000 mg | ORAL_CAPSULE | Freq: Two times a day (BID) | ORAL | 2 refills | Status: DC

## 2022-05-26 MED ORDER — CHLORHEXIDINE GLUCONATE CLOTH 2 % EX PADS: 6.0000 | MEDICATED_PAD | Freq: Once | CUTANEOUS | Status: DC

## 2022-05-26 MED ORDER — FENTANYL CITRATE (PF) 250 MCG/5ML IJ SOLN
INTRAMUSCULAR | Status: AC
  Filled 2022-05-26: qty 5

## 2022-05-26 MED ORDER — ONDANSETRON HCL 4 MG/2ML IJ SOLN: 4.0000 mg | Freq: Once | INTRAMUSCULAR | Status: DC | PRN

## 2022-05-26 MED ORDER — OXYCODONE HCL 5 MG PO TABS: 5.0000 mg | ORAL_TABLET | ORAL | 0 refills | Status: DC | PRN

## 2022-05-26 MED ORDER — CHLORHEXIDINE GLUCONATE 0.12 % MT SOLN: 15.0000 mL | Freq: Once | OROMUCOSAL | Status: AC

## 2022-05-26 MED ORDER — FENTANYL CITRATE (PF) 100 MCG/2ML IJ SOLN
25.0000 ug | INTRAMUSCULAR | Status: DC | PRN
  Administered 2022-05-26 (×2): 50 ug via INTRAVENOUS

## 2022-05-26 MED ORDER — OXYCODONE HCL 5 MG/5ML PO SOLN: 5.0000 mg | Freq: Once | ORAL | Status: DC | PRN

## 2022-05-26 MED ORDER — PROPOFOL 10 MG/ML IV BOLUS
INTRAVENOUS | Status: AC
  Filled 2022-05-26: qty 20

## 2022-05-26 MED ORDER — IBUPROFEN 600 MG PO TABS: 600.0000 mg | ORAL_TABLET | Freq: Four times a day (QID) | ORAL | 1 refills | Status: DC

## 2022-05-26 MED ORDER — SUGAMMADEX SODIUM 200 MG/2ML IV SOLN
INTRAVENOUS | Status: DC | PRN
  Administered 2022-05-26: 200 mg via INTRAVENOUS

## 2022-05-26 MED ORDER — ACETAMINOPHEN 500 MG PO TABS: 1000.0000 mg | ORAL_TABLET | Freq: Four times a day (QID) | ORAL | 3 refills | Status: DC

## 2022-05-26 MED ORDER — ACETAMINOPHEN 325 MG PO TABS: 325.0000 mg | ORAL_TABLET | ORAL | Status: DC | PRN

## 2022-05-26 MED ORDER — FENTANYL CITRATE (PF) 100 MCG/2ML IJ SOLN
INTRAMUSCULAR | Status: AC
  Filled 2022-05-26: qty 2

## 2022-05-26 SURGICAL SUPPLY — 38 items
APPLIER CLIP 5 13 M/L LIGAMAX5 (MISCELLANEOUS) ×2
APR CLP MED LRG 5 ANG JAW (MISCELLANEOUS) ×1
BAG SPEC RTRVL 10 TROC 200 (ENDOMECHANICALS) ×1
BLADE CLIPPER SURG (BLADE) ×1 IMPLANT
CANISTER SUCT 3000ML PPV (MISCELLANEOUS) ×2 IMPLANT
CHLORAPREP W/TINT 26 (MISCELLANEOUS) ×2 IMPLANT
CLIP APPLIE 5 13 M/L LIGAMAX5 (MISCELLANEOUS) ×1 IMPLANT
COVER SURGICAL LIGHT HANDLE (MISCELLANEOUS) ×2 IMPLANT
DERMABOND ADVANCED (GAUZE/BANDAGES/DRESSINGS) ×1
DERMABOND ADVANCED .7 DNX12 (GAUZE/BANDAGES/DRESSINGS) ×1 IMPLANT
DISSECTOR BLUNT TIP ENDO 5MM (MISCELLANEOUS) IMPLANT
ELECT CAUTERY BLADE 6.4 (BLADE) ×2 IMPLANT
ELECT REM PT RETURN 9FT ADLT (ELECTROSURGICAL) ×2
ELECTRODE REM PT RTRN 9FT ADLT (ELECTROSURGICAL) ×1 IMPLANT
GLOVE BIO SURGEON STRL SZ 6.5 (GLOVE) ×2 IMPLANT
GLOVE BIOGEL PI IND STRL 6 (GLOVE) ×1 IMPLANT
GLOVE BIOGEL PI INDICATOR 6 (GLOVE) ×1
GOWN STRL REUS W/ TWL LRG LVL3 (GOWN DISPOSABLE) ×3 IMPLANT
GOWN STRL REUS W/TWL LRG LVL3 (GOWN DISPOSABLE) ×6
KIT BASIN OR (CUSTOM PROCEDURE TRAY) ×2 IMPLANT
KIT TURNOVER KIT B (KITS) ×2 IMPLANT
NS IRRIG 1000ML POUR BTL (IV SOLUTION) ×2 IMPLANT
PAD ARMBOARD 7.5X6 YLW CONV (MISCELLANEOUS) ×2 IMPLANT
PENCIL BUTTON HOLSTER BLD 10FT (ELECTRODE) ×2 IMPLANT
POUCH RETRIEVAL ECOSAC 10 (ENDOMECHANICALS) ×1 IMPLANT
POUCH RETRIEVAL ECOSAC 10MM (ENDOMECHANICALS) ×2
SCISSORS LAP 5X35 DISP (ENDOMECHANICALS) ×2 IMPLANT
SET IRRIG TUBING LAPAROSCOPIC (IRRIGATION / IRRIGATOR) IMPLANT
SET TUBE SMOKE EVAC HIGH FLOW (TUBING) ×2 IMPLANT
SLEEVE ENDOPATH XCEL 5M (ENDOMECHANICALS) ×4 IMPLANT
SUT MNCRL AB 4-0 PS2 18 (SUTURE) ×2 IMPLANT
SUT VIC AB 0 UR5 27 (SUTURE) IMPLANT
SUT VICRYL 0 AB UR-6 (SUTURE) ×2 IMPLANT
TOWEL GREEN STERILE FF (TOWEL DISPOSABLE) ×2 IMPLANT
TRAY LAPAROSCOPIC MC (CUSTOM PROCEDURE TRAY) ×2 IMPLANT
TROCAR XCEL BLUNT TIP 100MML (ENDOMECHANICALS) ×2 IMPLANT
TROCAR Z-THREAD OPTICAL 5X100M (TROCAR) ×2 IMPLANT
WATER STERILE IRR 1000ML POUR (IV SOLUTION) ×2 IMPLANT

## 2022-05-26 NOTE — Op Note (Signed)
   Operative Note  Date: 05/26/2022  Procedure: laparoscopic cholecystectomy  Pre-op diagnosis: symptomatic cholelithiasis Post-op diagnosis: same  Indication and clinical history: The patient is a 61 y.o. year old male with symptomatic cholelithiasis  Surgeon: Jesusita Oka, MD  Anesthesiologist: Ambrose Pancoast, MD Anesthesia: General  Findings:  Specimen: gallbladder EBL: 5cc Drains/Implants: none  Disposition: PACU - hemodynamically stable.  Description of procedure: The patient was positioned supine on the operating room table. Time-out was performed verifying correct patient, procedure, signature of informed consent, and administration of pre-operative antibiotics, VTE prophylaxis with heparin. General anesthetic induction and intubation were uneventful. The abdomen was prepped and draped in the usual sterile fashion. An infra-umbilical incision was made using an open technique using zero vicryl stay sutures on either side of the fascia and a 13m Hassan port inserted. After establishing pneumoperitoneum, which the patient tolerated well, the abdominal cavity was inspected and no injury of any intra-abdominal structures was identified. Additional ports were placed under direct visualization and using local anesthetic: two 584mports in the right subcostal region and a 81m56mort in the epigastric region. The patient was re-positioned to reverse Trendelenburg and right side up. Adhesiolysis was performed to expose the gallbladder, which was then retracted cephalad. The infundibulum was identified and retracted toward the right lower quadrant. The peritoneum was incised over the infundibulum and the triangle of Calot dissected to expose the critical view of safety. With clear identification and isolation of the cystic duct and cystic artery, the cystic artery was doubly clipped and divided. After this, the cystic duct was identified as a single structure entering the gallbladder, and was also doubly  clipped and divided. The gallbladder was dissected off the liver bed using electrocautery and hemostasis of the liver bed was confirmed prior to separation of the final peritoneal attachments of the gallbladder to the liver bed. After transection of the final peritoneal attachments, the gallbladder was placed in an endoscopic specimen retrieval bag, removed via the umbilical port site, and sent to pathology as a permanent specimen. The gallbladder fossa was inspected confirming hemostasis, the absence of bile leakage from the cystic duct stump, and correct placement of clips on the cystic artery and cystic duct stumps. The abdomen was desufflated and the fascia of the umbilical port site was closed using the previously placed stay sutures. Additional local anesthetic was administered at the umbilical port site.  The skin of all incisions was closed with 4-0 monocryl. Sterile dressings were applied. All sponge and instrument counts were correct at the conclusion of the procedure. The patient was awakened from anesthesia, extubated uneventfully, and transported to the PACU - hemodynamically stable.. There were no complications.    AyeJesusita OkaD General and [facility]

## 2022-05-26 NOTE — Anesthesia Procedure Notes (Signed)
Procedure Name: Intubation Date/Time: 05/26/2022 10:30 AM  Performed by: Michele Rockers, CRNAPre-anesthesia Checklist: Patient identified, Patient being monitored, Timeout performed, Emergency Drugs available and Suction available Patient Re-evaluated:Patient Re-evaluated prior to induction Oxygen Delivery Method: Circle System Utilized Preoxygenation: Pre-oxygenation with 100% oxygen Induction Type: IV induction Ventilation: Mask ventilation without difficulty Laryngoscope Size: Miller and 2 Grade View: Grade I Tube type: Oral Tube size: 7.5 mm Number of attempts: 1 Airway Equipment and Method: Stylet Placement Confirmation: ETT inserted through vocal cords under direct vision, positive ETCO2 and breath sounds checked- equal and bilateral Secured at: 21 cm Tube secured with: Tape Dental Injury: Teeth and Oropharynx as per pre-operative assessment

## 2022-05-26 NOTE — Discharge Instructions (Addendum)
[facility]  LAPAROSCOPIC SURGERY: POST OP INSTRUCTIONS Always review your discharge instruction sheet given to you by the facility where your surgery was performed. IF YOU HAVE DISABILITY OR FAMILY LEAVE FORMS, YOU MUST BRING THEM TO THE OFFICE FOR PROCESSING.   DO NOT GIVE THEM TO YOUR DOCTOR.  PAIN CONTROL  Pain regimen: take over-the-counter tylenol (acetaminophen) '1000mg'$  every six hours, the prescription ibuprofen ('600mg'$ ) every six hours and the robaxin (methocarbamol) '750mg'$  every six hours. With all three of these, you should be taking something every two hours. Example: tylenol ( acetaminophen) at 8am, ibuprofen at 10am, robaxin (methocarbamol) at 12pm, tylenol (acetaminophen) again at 2pm, ibuprofen again at 4pm, robaxin (methocarbamol) at 6pm. You also have a prescription for oxycodone, which should be taken if the tylenol (acetaminophen), ibuprofen, and robaxin (methocarbamol) are not enough to control your pain. You may take the oxycodone as frequently as every four hours as needed, but if you are taking the other medications as above, you should not need the oxycodone this frequently. You have also been given a prescription for colace (docusate) which is a stool softener. Please take this as prescribed because the oxycodone can cause constipation and the colace (docusate) will minimize or prevent constipation. Do not drive while taking or under the influence of the oxycodone as it is a narcotic medication. Use ice packs to help control pain. If you need a refill on your pain medication, please contact your pharmacy.  They will contact our office to request authorization. Prescriptions will not be filled after 5pm or on week-ends.  HOME MEDICATIONS Take your usually prescribed medications unless otherwise directed.  DIET You should follow a light diet the first few days after arrival home.  Be sure to include lots of fluids daily.   CONSTIPATION It is common to  experience some constipation after surgery and if you are taking pain medication.  Increasing fluid intake and taking a stool softener (such as Colace) will usually help or prevent this problem from occurring.  A mild laxative (Milk of Magnesia or Miralax) should be taken according to package instructions if there are no bowel movements after 48 hours.  WOUND/INCISION CARE Most patients will experience some swelling and bruising in the area of the incisions.  Ice packs will help.  Swelling and bruising can take several days to resolve.  May shower beginning 03/16/22.  Do not peel off or scrub skin glue. May allow warm soapy water to run over incision, then rinse and pat dry.  Do not soak in any water (tubs, hot tubs, pools, lakes, oceans) for one week.   ACTIVITIES You may resume regular (light) daily activities beginning the next day--such as daily self-care, walking, climbing stairs--gradually increasing activities as tolerated.  You may have sexual intercourse when it is comfortable.   No lifting greater than 5 pounds for six weeks.  You may drive when you are no longer taking narcotic pain medication, you can comfortably wear a seatbelt, and you can safely maneuver your car and apply brakes.  FOLLOW-UP You should see your doctor in the office for a follow-up appointment approximately 2-3 weeks after your surgery.  You should have been given your post-op/follow-up appointment when your surgery was scheduled.  If you did not receive a post-op/follow-up appointment, make sure that you call for this appointment within a day or two after you arrive home to insure a convenient appointment time.  WHEN TO CALL YOUR DOCTOR: Fever over 101.5 Inability to urinate  Continued bleeding from incision. Increased pain, redness, or drainage from the incision. Increasing abdominal pain  The clinic staff is available to answer your questions during regular business hours.  Please don't hesitate to call and ask  to speak to one of the nurses for clinical concerns.  If you have a medical emergency, go to the nearest emergency room or call 911.  A surgeon from [facility] is always on call at the hospital. [address], [place], [place], [place]  [address] ? [address], [place], [place]   [address] [phone] ? [phone] ? FAX [phone] Web site: [email or web address]

## 2022-05-26 NOTE — H&P (Addendum)
    Tony Jacobs is an 61 y.o. male.   HPI: 34M with symptomatic cholelithiasis. The patient has had no hospitalizations, doctors visits, ER visits, surgeries, or newly diagnosed allergies since being seen in the office. PCP Collene Leyden, appointment scheduled for 05/29/22. Had a tooth extraction since being seen in the office by Dr. Luvenia Heller at [facility]. RBBB on EKG this AM, no previous.    Past Medical History:  Diagnosis Date   Arthritis    RA   Cancer ([place])    on lip   Chronic kidney disease    kidney stone   Heart murmur    Hypertension     History reviewed. No pertinent surgical history.  Family History  Problem Relation Age of Onset   Hypertension Mother    Heart failure Mother    Diabetes Mother    Hypertension Father    Colon cancer Maternal Grandfather    Esophageal cancer Neg Hx    Stomach cancer Neg Hx    Rectal cancer Neg Hx     Social History:  reports that he has never smoked. He has been exposed to tobacco smoke. He has never used smokeless tobacco. He reports that he does not drink alcohol and does not use drugs.  Allergies: No Known Allergies  Medications: I have reviewed the patient's current medications.  No results found for this or any previous visit (from the past 48 hour(s)).  No results found.  ROS 10 point review of systems is negative except as listed above in HPI.   Physical Exam Blood pressure (!) 160/82, pulse 93, temperature 98.6 F (37 C), temperature source Oral, resp. rate 18, height '5\' 11"'$  (1.803 m), weight 99.8 kg, SpO2 98 %. Constitutional: well-developed, well-nourished HEENT: pupils equal, round, reactive to light, 33m b/l, moist conjunctiva, external inspection of ears and nose normal, hearing intact Oropharynx: normal oropharyngeal mucosa, normal dentition Neck: no thyromegaly, trachea midline, no midline cervical tenderness to palpation Chest: breath sounds equal bilaterally, normal respiratory effort, no midline or  lateral chest wall tenderness to palpation/deformity Abdomen: soft, NT, no bruising, no hepatosplenomegaly GU: no blood at urethral meatus of penis, no scrotal masses or abnormality  Back: no wounds, no thoracic/lumbar spine tenderness to palpation, no thoracic/lumbar spine stepoffs Rectal: deferred Extremities: 2+ radial and pedal pulses bilaterally, intact motor and sensation bilateral UE and LE, no peripheral edema MSK: normal gait/station, no clubbing/cyanosis of fingers/toes, normal ROM of all four extremities Skin: warm, dry, no rashes Psych: normal memory, normal mood/affect     Assessment/Plan: 34M with symptomatic cholelithiasis. Informed consent was obtained after detailed explanation of risks, including bleeding, infection, biloma, hematoma, injury to common bile duct, need for IOC to delineate anatomy, and need for conversion to open procedure. All questions answered to the patient's satisfaction. Will send a message to PCP regarding RBBB for further f/u.    AJesusita Oka MD General and [facility]

## 2022-05-26 NOTE — Anesthesia Postprocedure Evaluation (Signed)
Anesthesia Post Note  Patient: Tony Jacobs  Procedure(s) Performed: LAPAROSCOPIC CHOLECYSTECTOMY (Abdomen)     Patient location during evaluation: PACU Anesthesia Type: General Level of consciousness: awake and alert Pain management: pain level controlled Vital Signs Assessment: post-procedure vital signs reviewed and stable Respiratory status: spontaneous breathing, nonlabored ventilation, respiratory function stable and patient connected to nasal cannula oxygen Cardiovascular status: blood pressure returned to baseline and stable Postop Assessment: no apparent nausea or vomiting Anesthetic complications: no   No notable events documented.  Last Vitals:  Vitals:   05/26/22 1210 05/26/22 1225  BP: 117/68 115/73  Pulse: 83 83  Resp: 14 17  Temp:  37 C  SpO2: 95% 94%    Last Pain:  Vitals:   05/26/22 1225  TempSrc:   PainSc: 3                  Estephany Perot

## 2022-05-26 NOTE — Transfer of Care (Signed)
Immediate Anesthesia Transfer of Care Note  Patient: Tony Jacobs  Procedure(s) Performed: LAPAROSCOPIC CHOLECYSTECTOMY (Abdomen)  Patient Location: PACU  Anesthesia Type:General  Level of Consciousness: drowsy, patient cooperative and responds to stimulation  Airway & Oxygen Therapy: Patient Spontanous Breathing and Patient connected to nasal cannula oxygen  Post-op Assessment: Report given to RN, Post -op Vital signs reviewed and stable and Patient moving all extremities X 4  Post vital signs: Reviewed and stable  Last Vitals:  Vitals Value Taken Time  BP 128/112 05/26/22 1139  Temp    Pulse 77 05/26/22 1140  Resp 15 05/26/22 1140  SpO2 97 % 05/26/22 1140  Vitals shown include unvalidated device data.  Last Pain:  Vitals:   05/26/22 0851  TempSrc:   PainSc: 0-No pain         Complications: No notable events documented.

## 2022-05-27 ENCOUNTER — Encounter (HOSPITAL_COMMUNITY): Payer: Self-pay | Admitting: Surgery

## 2022-05-28 LAB — SURGICAL PATHOLOGY

## 2022-06-12 ENCOUNTER — Ambulatory Visit: Payer: 59 | Admitting: Cardiovascular Disease

## 2022-06-12 ENCOUNTER — Encounter: Payer: Self-pay | Admitting: Cardiovascular Disease

## 2022-06-12 VITALS — BP 114/70 | HR 100 | Ht 71.0 in | Wt 216.0 lb

## 2022-06-12 DIAGNOSIS — R011 Cardiac murmur, unspecified: Secondary | ICD-10-CM | POA: Diagnosis not present

## 2022-06-12 NOTE — Progress Notes (Signed)
Chief Complaint  Patient presents with   New Patient (Initial Visit)    Abnormal EKG   History of Present Illness: 61 yo male with history of rheumatoid arthritis, skin cancer, HTN, GERD here today as a new consult for the evaluation of an abnormal EKG. EKG 05/26/22 with sinus rhythm with RBBB. He feels great. He is very active. 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 No chest pain, dyspnea, dizziness, LE edema. He has never had cardiac issues but was told in the past that he had a heart murmur.    Primary Care Physician: Kelton Pillar, MD   Past Medical History:  Diagnosis Date   Arthritis    RA   Cancer [facility])    on lip   Chronic kidney disease    kidney stone   Heart murmur    Hypertension     Past Surgical History:  Procedure Laterality Date   CHOLECYSTECTOMY N/A 05/26/2022   Procedure: LAPAROSCOPIC CHOLECYSTECTOMY;  Surgeon: Jesusita Oka, MD;  Location: [facility] OR;  Service: General;  Laterality: N/A;    Current Outpatient Medications  Medication Sig Dispense Refill   aspirin 81 MG tablet Take 81 mg by mouth daily.     folic acid (FOLVITE) 1 MG tablet Take 1 mg by mouth daily.     inFLIXimab (REMICADE) 100 MG injection Inject 400 mg into the vein every 8 (eight) weeks.     losartan-hydrochlorothiazide (HYZAAR) 100-12.5 MG tablet Take 1 tablet by mouth daily.     MELATONIN PO Take 3 mg by mouth at bedtime.     methotrexate (RHEUMATREX) 2.5 MG tablet Take 2.5 mg by mouth once a week. Caution:Chemotherapy. Protect from light.     Multiple Vitamin tablet Take 1 tablet by mouth daily. One a day men's health     pantoprazole (PROTONIX) 40 MG tablet Take 40 mg by mouth daily.     No current facility-administered medications for this visit.   Facility-Administered Medications Ordered in Other Visits  Medication Dose Route Frequency Provider Last Rate Last Admin   heparin injection 5,000 Units  5,000 Units Subcutaneous Once Lovick, Montel Culver, MD        No Known  Allergies  Social History   Socioeconomic History   Marital status: Married    Spouse name: Not on file   Number of children: 1   Years of education: Not on file   Highest education level: Not on file  Occupational History   Occupation: Works in the sawmill  Tobacco Use   Smoking status: Never    Passive exposure: Past   Smokeless tobacco: Never  Vaping Use   Vaping Use: Never used  Substance and Sexual Activity   Alcohol use: No   Drug use: No   Sexual activity: Yes  Other Topics Concern   Not on file  Social History Narrative   Not on file   Social Determinants of Health   Financial Resource Strain: Not on file  Food Insecurity: Not on file  Transportation Needs: Not on file  Physical Activity: Not on file  Stress: Not on file  Social Connections: Not on file  Intimate Partner Violence: Not on file    Family History  Problem Relation Age of Onset   Hypertension Mother    Heart failure Mother    Diabetes Mother    CVA Mother    Hypertension Father    Dementia Father    Colon cancer Maternal Grandfather  Esophageal cancer Neg Hx    Stomach cancer Neg Hx    Rectal cancer Neg Hx     Review of Systems:  As stated in the HPI and otherwise negative.   BP 114/70   Pulse 100   Ht '5\' 11"'$  (1.803 m)   Wt 216 lb (98 kg)   SpO2 97%   BMI 30.13 kg/m   Physical Examination: General: Well developed, well nourished, NAD  HEENT: OP clear, mucus membranes moist  SKIN: warm, dry. No rashes. Neuro: No focal deficits  Musculoskeletal: Muscle strength 5/5 all ext  Psychiatric: Mood and affect normal  Neck: No JVD, no carotid bruits, no thyromegaly, no lymphadenopathy.  Lungs:Clear bilaterally, no wheezes, rhonci, crackles Cardiovascular: Regular rate and rhythm. No murmurs, gallops or rubs. Abdomen:Soft. Bowel sounds present. Non-tender.  Extremities: No lower extremity edema. Pulses are 2 + in the bilateral DP/PT.  EKG:  EKG is not ordered today. The ekg  from  05/26/22 demonstrates NSR, RBBB   Recent Labs: 05/26/2022: BUN 11; Creatinine, Ser 1.11; Hemoglobin 16.0; Platelets 263; Potassium 3.5; Sodium 139   Lipid Panel No results found for: "CHOL", "TRIG", "HDL", "CHOLHDL", "VLDL", "LDLCALC", "LDLDIRECT"   Wt Readings from Last 3 Encounters:  06/12/22 216 lb (98 kg)  05/26/22 220 lb (99.8 kg)  04/20/22 225 lb (102.1 kg)    Assessment and Plan:   1. RBBB: Likely not clinically significant. With small Q wave in lead 3, will arrange echo to look at wall motion.   2. Cardiac murmur: Echo to assess valves  Labs/ tests ordered today include:   Orders Placed This Encounter  Procedures   ECHOCARDIOGRAM COMPLETE   Disposition:   F/U with me as needed.   Signed, Lauree Chandler, MD 06/12/2022 12:05 PM    [facility] [address], [place], [place]  [address] Phone: [phone]; Fax: [phone]

## 2022-06-12 NOTE — Patient Instructions (Addendum)
Medication Instructions:  No changes *If you need a refill on your cardiac medications before your next appointment, please call your pharmacy*   Lab Work: none If you have labs (blood work) drawn today and your tests are completely normal, you will receive your results only by: [place] (if you have MyChart) OR A paper copy in the mail If you have any lab test that is abnormal or we need to change your treatment, we will call you to review the results.   Testing/Procedures: Your physician has requested that you have an echocardiogram. Echocardiography is a painless test that uses sound waves to create images of your heart. It provides your doctor with information about the size and shape of your heart and how well your heart's chambers and valves are working. This procedure takes approximately one hour. There are no restrictions for this procedure.   Follow-Up: As needed.   Important Information About Sugar

## 2022-06-20 ENCOUNTER — Ambulatory Visit (HOSPITAL_COMMUNITY): Payer: 59 | Attending: Cardiovascular Disease

## 2022-06-20 DIAGNOSIS — R011 Cardiac murmur, unspecified: Secondary | ICD-10-CM | POA: Diagnosis not present

## 2022-06-20 LAB — ECHOCARDIOGRAM COMPLETE
Area-P 1/2: 3.77 cm2
S' Lateral: 2.9 cm

## 2024-02-10 ENCOUNTER — Encounter: Payer: Self-pay | Admitting: Internal Medicine

## 2024-04-01 ENCOUNTER — Ambulatory Visit (AMBULATORY_SURGERY_CENTER)

## 2024-04-01 ENCOUNTER — Encounter: Payer: Self-pay | Admitting: Internal Medicine

## 2024-04-01 VITALS — Ht 71.0 in | Wt 218.0 lb

## 2024-04-01 DIAGNOSIS — Z1211 Encounter for screening for malignant neoplasm of colon: Secondary | ICD-10-CM

## 2024-04-01 MED ORDER — NA SULFATE-K SULFATE-MG SULF 17.5-3.13-1.6 GM/177ML PO SOLN: 1.0000 | Freq: Once | ORAL | 0 refills | Status: AC

## 2024-04-01 NOTE — Progress Notes (Signed)

## 2024-04-14 ENCOUNTER — Ambulatory Visit: Admitting: Internal Medicine

## 2024-04-14 ENCOUNTER — Encounter: Payer: Self-pay | Admitting: Internal Medicine

## 2024-04-14 VITALS — BP 112/66 | HR 72 | Temp 98.2°F | Resp 18 | Ht 71.0 in | Wt 218.0 lb

## 2024-04-14 DIAGNOSIS — Z1211 Encounter for screening for malignant neoplasm of colon: Secondary | ICD-10-CM

## 2024-04-14 DIAGNOSIS — K5730 Diverticulosis of large intestine without perforation or abscess without bleeding: Secondary | ICD-10-CM

## 2024-04-14 DIAGNOSIS — D120 Benign neoplasm of cecum: Secondary | ICD-10-CM | POA: Diagnosis not present

## 2024-04-14 DIAGNOSIS — D122 Benign neoplasm of ascending colon: Secondary | ICD-10-CM

## 2024-04-14 DIAGNOSIS — D125 Benign neoplasm of sigmoid colon: Secondary | ICD-10-CM

## 2024-04-14 DIAGNOSIS — K648 Other hemorrhoids: Secondary | ICD-10-CM | POA: Diagnosis not present

## 2024-04-14 MED ORDER — SODIUM CHLORIDE 0.9 % IV SOLN: 500.0000 mL | Freq: Once | INTRAVENOUS | Status: DC

## 2024-04-14 NOTE — Patient Instructions (Signed)

## 2024-04-14 NOTE — Progress Notes (Signed)
 HISTORY OF PRESENT ILLNESS:  Tony Jacobs is a 63 y.o. male who presents today for screening colonoscopy.  Previous examination 2015 was negative for neoplasia.  REVIEW OF SYSTEMS:  All non-GI ROS negative except for  Past Medical History:  Diagnosis Date   Arthritis    RA   Cancer (HCC)    on lip   Chronic kidney disease    kidney stone   Heart murmur    Hypertension     Past Surgical History:  Procedure Laterality Date   CHOLECYSTECTOMY N/A 05/26/2022   Procedure: LAPAROSCOPIC CHOLECYSTECTOMY;  Surgeon: Paola Dreama SAILOR, MD;  Location: [facility] OR;  Service: General;  Laterality: N/A;    Social History Alm GORMAN Lunger  reports that he has never smoked. He has been exposed to tobacco smoke. He has never used smokeless tobacco. He reports that he does not drink alcohol and does not use drugs.  family history includes CVA in his mother; Colon cancer in his maternal grandfather; Dementia in his father; Diabetes in his mother; Heart failure in his mother; Hypertension in his father and mother.  No Known Allergies     PHYSICAL EXAMINATION: Vital signs: BP 130/84   Pulse 80   Temp 98.2 F (36.8 C) (Temporal)   Resp 17   Ht 5' 11 (1.803 m)   Wt 218 lb (98.9 kg)   SpO2 100%   BMI 30.40 kg/m  General: Well-developed, well-nourished, no acute distress HEENT: Sclerae are anicteric, conjunctiva pink. Oral mucosa intact Lungs: Clear Heart: Regular Abdomen: soft, nontender, nondistended, no obvious ascites, no peritoneal signs, normal bowel sounds. No organomegaly. Extremities: No edema Psychiatric: alert and oriented x3. Cooperative     ASSESSMENT:  Colon cancer screening   PLAN:   Screening colonoscopy

## 2024-04-14 NOTE — Progress Notes (Signed)
 Sedate, gd SR, tolerated procedure well, VSS, report to RN

## 2024-04-14 NOTE — Op Note (Signed)
 [facility] Patient Name: Tony Jacobs Procedure Date: 04/14/2024 10:00 AM MRN: [id number] Endoscopist: Norleen SAILOR. Abran , MD, [id number] Age: 63 Referring MD:  Date of Birth: 02/26/1961 Gender: Male Account #: [id number] Procedure:                Colonoscopy with cold snare polypectomy x 3; biopsy                            polypectomy x 1 Indications:              Screening for colorectal malignant neoplasm.                            Previous examination 2015 was negative for neoplasia Medicines:                Monitored Anesthesia Care Procedure:                Pre-Anesthesia Assessment:                           - Prior to the procedure, a History and Physical                            was performed, and patient medications and                            allergies were reviewed. The patient's tolerance of                            previous anesthesia was also reviewed. The risks                            and benefits of the procedure and the sedation                            options and risks were discussed with the patient.                            All questions were answered, and informed consent                            was obtained. Prior Anticoagulants: The patient has                            taken no anticoagulant or antiplatelet agents. ASA                            Grade Assessment: II - A patient with mild systemic                            disease. After reviewing the risks and benefits,                            the patient was deemed in satisfactory condition to  undergo the procedure.                           After obtaining informed consent, the colonoscope                            was passed under direct vision. Throughout the                            procedure, the patient's blood pressure, pulse, and                            oxygen saturations were monitored continuously. The                            CF  [id number] #[id number] was introduced through the anus                            and advanced to the the cecum, identified by                            appendiceal orifice and ileocecal valve. The                            ileocecal valve, appendiceal orifice, and rectum                            were photographed. The quality of the bowel                            preparation was excellent. The colonoscopy was                            performed without difficulty. The patient tolerated                            the procedure well. The bowel preparation used was                            SUPREP via split dose instruction. Scope In: 10:18:12 AM Scope Out: 10:33:18 AM Scope Withdrawal Time: 0 hours 13 minutes 45 seconds  Total Procedure Duration: 0 hours 15 minutes 6 seconds  Findings:                 Three polyps were found in the sigmoid colon and                            cecum. The polyps were 2 to 4 mm in size. These                            polyps were removed with a cold snare. Resection                            and retrieval were complete.  A less than 1 mm polyp was found in the ascending                            colon. The polyp was removed with a cold snare.                            Resection and retrieval were complete.                           Internal hemorrhoids were found during                            retroflexion. Few sigmoid diverticula present.                           The exam was otherwise without abnormality on                            direct and retroflexion views. Complications:            No immediate complications. Estimated blood loss:                            None. Estimated Blood Loss:     Estimated blood loss: none. Impression:               - Three 2 to 4 mm polyps in the sigmoid colon and                            in the cecum, removed with a cold snare. Resected                            and retrieved.                            - One less than 1 mm polyp in the ascending colon,                            removed with a cold snare. Resected and retrieved.                           - Internal hemorrhoids. Sigmoid diverticulosis.                           - The examination was otherwise normal on direct                            and retroflexion views. Recommendation:           - Repeat colonoscopy in 5 years if 3 or more                            adenomas, 7 years if 1 or 2 adenomatous or 10 years  if no adenomas, for surveillance.                           - Patient has a contact number available for                            emergencies. The signs and symptoms of potential                            delayed complications were discussed with the                            patient. Return to normal activities tomorrow.                            Written discharge instructions were provided to the                            patient.                           - Resume previous diet.                           - Continue present medications.                           - Await pathology results. Norleen SAILOR. Abran, MD 04/14/2024 10:42:13 AM This report has been signed electronically.

## 2024-04-14 NOTE — Progress Notes (Signed)
 Pt's states no medical or surgical changes since previsit or office visit.

## 2024-04-14 NOTE — Progress Notes (Signed)
 Called to room to assist during endoscopic procedure.  Patient ID and intended procedure confirmed with present staff. Received instructions for my participation in the procedure from the performing physician.

## 2024-04-15 ENCOUNTER — Telehealth: Payer: Self-pay | Admitting: *Deleted

## 2024-04-15 NOTE — Telephone Encounter (Signed)
  Follow up Call-     04/14/2024    9:16 AM  Call back number  Post procedure Call Back phone  # [phone] [place], spouse  Permission to leave phone message Yes     Patient questions:  Do you have a fever, pain , or abdominal swelling? No. Pain Score  0 *  Have you tolerated food without any problems? Yes.    Have you been able to return to your normal activities? Yes.    Do you have any questions about your discharge instructions: Diet   No. Medications  No. Follow up visit  No.  Do you have questions or concerns about your Care? No.  Actions: * If pain score is 4 or above: No action needed, pain <4.

## 2024-04-17 LAB — SURGICAL PATHOLOGY

## 2024-04-18 ENCOUNTER — Ambulatory Visit: Payer: Self-pay | Admitting: Internal Medicine
